# Patient Record
Sex: Male | Born: 1990 | Race: Black or African American | Hispanic: No | Marital: Single | Smoking: Current every day smoker
Health system: Southern US, Community
[De-identification: ages and names within clinical notes are randomized; demographics above are authoritative.]

## PROBLEM LIST (undated history)

## (undated) DIAGNOSIS — F319 Bipolar disorder, unspecified: Secondary | ICD-10-CM

## (undated) DIAGNOSIS — Z21 Asymptomatic human immunodeficiency virus [HIV] infection status: Secondary | ICD-10-CM

## (undated) DIAGNOSIS — B1920 Unspecified viral hepatitis C without hepatic coma: Secondary | ICD-10-CM

## (undated) DIAGNOSIS — B20 Human immunodeficiency virus [HIV] disease: Secondary | ICD-10-CM

---
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
## 2010-08-03 ENCOUNTER — Emergency Department (HOSPITAL_COMMUNITY): Admission: EM | Admit: 2010-08-03 | Discharge: 2010-08-03 | Payer: Self-pay | Admitting: Emergency Medicine

## 2010-08-03 ENCOUNTER — Inpatient Hospital Stay (HOSPITAL_COMMUNITY): Admission: AD | Admit: 2010-08-03 | Discharge: 2010-08-08 | Payer: Self-pay | Admitting: Psychiatry

## 2010-08-03 ENCOUNTER — Ambulatory Visit: Payer: Self-pay | Admitting: Psychiatry

## 2010-09-08 ENCOUNTER — Inpatient Hospital Stay (HOSPITAL_COMMUNITY)
Admission: EM | Admit: 2010-09-08 | Discharge: 2010-09-21 | Payer: Self-pay | Attending: Psychiatry | Admitting: Psychiatry

## 2010-12-10 LAB — HEPATITIS C ANTIBODY: HCV Ab: NEGATIVE

## 2010-12-10 LAB — RPR: RPR Ser Ql: NONREACTIVE

## 2010-12-10 LAB — GC/CHLAMYDIA PROBE AMP, URINE: GC Probe Amp, Urine: NEGATIVE

## 2010-12-11 LAB — BASIC METABOLIC PANEL
BUN: 8 mg/dL (ref 6–23)
CO2: 29 mEq/L (ref 19–32)
Chloride: 106 mEq/L (ref 96–112)
Glucose, Bld: 115 mg/dL — ABNORMAL HIGH (ref 70–99)

## 2010-12-11 LAB — DIFFERENTIAL
Basophils Absolute: 0 10*3/uL (ref 0.0–0.1)
Basophils Relative: 1 % (ref 0–1)
Eosinophils Relative: 3 % (ref 0–5)
Lymphocytes Relative: 40 % (ref 12–46)
Lymphs Abs: 2.1 10*3/uL (ref 0.7–4.0)
Monocytes Absolute: 0.4 10*3/uL (ref 0.1–1.0)

## 2010-12-11 LAB — CBC
Hemoglobin: 16.2 g/dL (ref 13.0–17.0)
MCHC: 34.7 g/dL (ref 30.0–36.0)
RBC: 5.45 MIL/uL (ref 4.22–5.81)
WBC: 5.2 10*3/uL (ref 4.0–10.5)

## 2010-12-11 LAB — RAPID URINE DRUG SCREEN, HOSP PERFORMED
Barbiturates: NOT DETECTED
Opiates: NOT DETECTED

## 2011-02-15 NOTE — Procedures (Signed)
EEG NUMBER:  [id number].   CLINICAL HISTORY:  The patient 20 year old young man with four episodes of  seizure-like activity described as loss of consciousness and shaking all  over lasting about 1 minute (780.39).   PROCEDURE:  The tracing is carried out on a 32 channel digital Cadwell  recorder reformatted into 16 channel montages with one devoted to EKG.  The  patient was awake and drowsy during the recording.  The International 10-20  system lead placement used.   DESCRIPTION OF FINDINGS:  Dominant frequency is a 9-11 Hz 40-80 microvolt  activity that is well regulated.   Background activity consists of mixed frequency alpha and upper theta range  activity with frontally predominant beta range components.  The patient  becomes drowsy with mixed frequency theta and delta range components.  The  patient does not drift into natural sleep.   There was no focal slowing.  There was no interictal epileptiform activity  in the form of spikes or sharp waves.  Photic stimulation failed to induce a  driving response.  Hyperventilation caused arousal from drowsiness.   EKG showed a regular sinus rhythm with a ventricular response of 60-66 beats  per minute.   IMPRESSION:  Normal record with the patient awake and drowsy.      Deanna Artis. Sharene Skeans, M.D.  Electronically Signed     ZOX:WRUE  D:  06/24/2006 12:42:43  T:  06/26/2006 10:26:25  Job #:  [id number]

## 2011-11-11 ENCOUNTER — Encounter (HOSPITAL_COMMUNITY): Payer: Self-pay

## 2011-11-11 ENCOUNTER — Emergency Department (HOSPITAL_COMMUNITY): Payer: Self-pay

## 2011-11-11 ENCOUNTER — Emergency Department (HOSPITAL_COMMUNITY)
Admission: EM | Admit: 2011-11-11 | Discharge: 2011-11-11 | Disposition: A | Discharge status: Home / Self Care | Payer: No Typology Code available for payment source | Attending: Emergency Medicine | Admitting: Emergency Medicine

## 2011-11-11 DIAGNOSIS — F17200 Nicotine dependence, unspecified, uncomplicated: Secondary | ICD-10-CM | POA: Insufficient documentation

## 2011-11-11 DIAGNOSIS — IMO0002 Reserved for concepts with insufficient information to code with codable children: Secondary | ICD-10-CM | POA: Insufficient documentation

## 2011-11-11 DIAGNOSIS — R51 Headache: Secondary | ICD-10-CM | POA: Insufficient documentation

## 2011-11-11 DIAGNOSIS — M25562 Pain in left knee: Secondary | ICD-10-CM

## 2011-11-11 DIAGNOSIS — M25569 Pain in unspecified knee: Secondary | ICD-10-CM | POA: Insufficient documentation

## 2011-11-11 DIAGNOSIS — M79609 Pain in unspecified limb: Secondary | ICD-10-CM | POA: Insufficient documentation

## 2011-11-11 MED ORDER — HYDROCODONE-ACETAMINOPHEN 5-325 MG PO TABS
1.0000 | ORAL_TABLET | Freq: Once | ORAL | Status: AC
  Administered 2011-11-11: 1 via ORAL
  Filled 2011-11-11: qty 1

## 2011-11-11 MED ORDER — HYDROCODONE-ACETAMINOPHEN 5-325 MG PO TABS: 1.0000 | ORAL_TABLET | ORAL | Status: AC | PRN
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
## 2011-11-11 NOTE — ED Provider Notes (Signed)
Medical screening examination/treatment/procedure(s) were performed by non-physician practitioner and as supervising physician I was immediately available for consultation/collaboration.   Joya Gaskins, MD 11/11/11 [phone]
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
## 2011-11-11 NOTE — Progress Notes (Signed)
Orthopedic Tech Progress Note Patient Details:  Mark [name] [birth year] [id number]  Other Ortho Devices Type of Ortho Device: Knee Sleeve Ortho Device Location: left knee Ortho Device Interventions: Application   Nikki Dom 11/11/2011, 10:16 PM

## 2013-02-28 ENCOUNTER — Emergency Department (HOSPITAL_COMMUNITY)
Admission: EM | Admit: 2013-02-28 | Discharge: 2013-02-28 | Disposition: A | Discharge status: Home / Self Care | Payer: Self-pay | Attending: Emergency Medicine | Admitting: Emergency Medicine

## 2013-02-28 ENCOUNTER — Encounter (HOSPITAL_COMMUNITY): Payer: Self-pay | Admitting: Emergency Medicine

## 2013-02-28 DIAGNOSIS — F17200 Nicotine dependence, unspecified, uncomplicated: Secondary | ICD-10-CM | POA: Insufficient documentation

## 2013-02-28 DIAGNOSIS — F319 Bipolar disorder, unspecified: Secondary | ICD-10-CM | POA: Insufficient documentation

## 2013-02-28 DIAGNOSIS — J3489 Other specified disorders of nose and nasal sinuses: Secondary | ICD-10-CM | POA: Insufficient documentation

## 2013-02-28 DIAGNOSIS — R51 Headache: Secondary | ICD-10-CM | POA: Insufficient documentation

## 2013-02-28 DIAGNOSIS — B1920 Unspecified viral hepatitis C without hepatic coma: Secondary | ICD-10-CM | POA: Insufficient documentation

## 2013-02-28 DIAGNOSIS — J029 Acute pharyngitis, unspecified: Secondary | ICD-10-CM | POA: Insufficient documentation

## 2013-02-28 HISTORY — DX: Unspecified viral hepatitis C without hepatic coma: B19.20

## 2013-02-28 HISTORY — DX: Bipolar disorder, unspecified: F31.9

## 2013-02-28 LAB — RAPID STREP SCREEN (MED CTR MEBANE ONLY): Streptococcus, Group A Screen (Direct): NEGATIVE

## 2013-02-28 MED ORDER — CEPHALEXIN 500 MG PO CAPS: ORAL_CAPSULE | ORAL | Status: DC

## 2013-02-28 MED ORDER — SULFAMETHOXAZOLE-TRIMETHOPRIM 800-160 MG PO TABS: 1.0000 | ORAL_TABLET | Freq: Two times a day (BID) | ORAL | Status: DC

## 2013-02-28 NOTE — ED Notes (Signed)
C/o sore throat x 1 week.

## 2013-02-28 NOTE — ED Provider Notes (Signed)
History     CSN: [id number]  Arrival date & time 02/28/13  1719   First MD Initiated Contact with Patient 02/28/13 1755      Chief Complaint  Patient presents with  . Sore Throat    (Consider location/radiation/quality/duration/timing/severity/associated sxs/prior treatment) HPI Mark [name] is a 22 y/o M with PMHx of Bipolar disorder and Hep C presenting to the ED with sore throat x 10 days - patient reported that the pain is constant, described as a burning sensation every time he swallows. Stated that the pain is worse with meat and hot fluids. Reported that the pain feels better with icy fluids. Patient reported that he has been using Cepacol with minimal relief. Stated that he has had mild nasal congestion this past week and minor headaches intermittently throughout the week. Reported that he is allergic to pollen. Denied fever, chills, sweating, inability to swallow, gi symptoms, urinary symptoms, ear complaints, eye complaints.    Past Medical History  Diagnosis Date  . Bipolar 1 disorder   . Hepatitis C     History reviewed. No pertinent past surgical history.  No family history on file.  History  Substance Use Topics  . Smoking status: Current Every Day Smoker  . Smokeless tobacco: Not on file  . Alcohol Use: No      Review of Systems  Constitutional: Negative for fever, chills and fatigue.  HENT: Positive for congestion (nasal) and sore throat. Negative for facial swelling, rhinorrhea, trouble swallowing, neck pain and neck stiffness.   Eyes: Negative for photophobia and visual disturbance.  Respiratory: Negative for chest tightness and shortness of breath.   Cardiovascular: Negative for chest pain.  Gastrointestinal: Negative for nausea, vomiting, abdominal pain, diarrhea, constipation and blood in stool.  Genitourinary: Negative for dysuria, hematuria, decreased urine volume and difficulty urinating.  Musculoskeletal: Negative for back pain and arthralgias.   Skin: Negative for rash.  Neurological: Positive for headaches. Negative for dizziness, weakness, light-headedness and numbness.  All other systems reviewed and are negative.    Allergies  Review of patient's allergies indicates no known allergies.  Home Medications   Current Outpatient Rx  Name  Route  Sig  Dispense  Refill  . cephALEXin (KEFLEX) 500 MG capsule      2 caps po bid x 7 days   28 capsule   0   . sulfamethoxazole-trimethoprim (BACTRIM DS,SEPTRA DS) 800-160 MG per tablet   Oral   Take 1 tablet by mouth 2 (two) times daily. One po bid x 7 days   14 tablet   0     BP 136/72  Pulse 82  Temp(Src) 99.9 F (37.7 C) (Oral)  Resp 20  SpO2 98%  Physical Exam  Nursing note and vitals reviewed. Constitutional: He is oriented to person, place, and time. He appears well-developed and well-nourished. No distress.  HENT:  Head: Normocephalic and atraumatic. No trismus in the jaw.  Mouth/Throat: Uvula is midline, oropharynx is clear and moist and mucous membranes are normal. No oral lesions. Normal dentition. No dental abscesses, edematous or lacerations. No oropharyngeal exudate, posterior oropharyngeal edema or posterior oropharyngeal erythema.  Uvula midline, symmetrical elevation Mild swelling and erythema noted to bilateral tonsils - negative exudate, petechiae noted Erythema noted to the posterior oropharynx.   Eyes: Conjunctivae and EOM are normal. Pupils are equal, round, and reactive to light. Right eye exhibits no discharge. Left eye exhibits no discharge.  Neck: Normal range of motion. Neck supple. No tracheal deviation present.  Negative  neck stiffness Negative nuchal rigidity  Mild swelling noted to bilateral tonsils - mobile, tender  Cardiovascular: Normal rate, regular rhythm and normal heart sounds.  Exam reveals no friction rub.   No murmur heard. Pulses:      Radial pulses are 2+ on the right side, and 2+ on the left side.  Pulmonary/Chest: Effort  normal and breath sounds normal. No respiratory distress. He has no wheezes. He has no rales.  Lymphadenopathy:    He has no cervical adenopathy.  Neurological: He is alert and oriented to person, place, and time. No cranial nerve deficit. He exhibits normal muscle tone. Coordination normal.  Cranial nerves III-XII grossly intact  Skin: Skin is warm and dry. No rash noted. He is not diaphoretic. No erythema.  Psychiatric: He has a normal mood and affect. His behavior is normal. Thought content normal.    ED Course  Procedures (including critical care time)  Labs Reviewed  RAPID STREP SCREEN  CULTURE, GROUP A STREP   No results found.   1. Sore throat       MDM  Patient stable, afebrile. Negative strep test. Uvula midline, mild erythema noted - mild discomfort upon palpation to the tonsils - less likely to be presence of peritonsillar abscess. Suspicion is to be of viral illness in nature. Patient aseptic, non-toxic appearing, no respiratory distress. Patient discharged - discharged with antibiotic coverage since sore throat ongoing for more than a week, with erythema and tenderness to palpation of the tonsils. Discussed care, rest, hydration. Referred to follow-up with PCP. Discussed with patient to monitor symptoms and if symptoms are to worsen or change to report back to the ED. Patient agreed to plan of care, understood, all questions answered.        Raymon Mutton, PA-C 03/01/13 0144

## 2013-03-01 NOTE — ED Provider Notes (Signed)
Medical screening examination/treatment/procedure(s) were performed by non-physician practitioner and as supervising physician I was immediately available for consultation/collaboration.  Margues Filippini, MD 03/01/13 0816 

## 2013-03-02 LAB — CULTURE, GROUP A STREP

## 2013-10-19 ENCOUNTER — Ambulatory Visit: Payer: Self-pay

## 2013-12-10 ENCOUNTER — Telehealth: Payer: Self-pay

## 2013-12-10 NOTE — Telephone Encounter (Signed)
Patient's cell number [phone] is disconnected .  His granmothers phone number is listed on referral as additional contact.  I left a message on machine for patient to contact our office for a new intake appointment.     [facility]    Tammy Gorden HarmsK King, RN

## 2014-12-20 DIAGNOSIS — R011 Cardiac murmur, unspecified: Secondary | ICD-10-CM | POA: Insufficient documentation

## 2014-12-20 DIAGNOSIS — F319 Bipolar disorder, unspecified: Secondary | ICD-10-CM | POA: Insufficient documentation

## 2016-03-02 ENCOUNTER — Emergency Department (HOSPITAL_COMMUNITY)
Admission: EM | Admit: 2016-03-02 | Discharge: 2016-03-02 | Disposition: A | Discharge status: Home / Self Care | Payer: Self-pay | Attending: Emergency Medicine | Admitting: Emergency Medicine

## 2016-03-02 ENCOUNTER — Encounter (HOSPITAL_COMMUNITY): Payer: Self-pay | Admitting: Nurse Practitioner

## 2016-03-02 ENCOUNTER — Emergency Department (HOSPITAL_COMMUNITY): Payer: Self-pay

## 2016-03-02 DIAGNOSIS — Z79899 Other long term (current) drug therapy: Secondary | ICD-10-CM | POA: Insufficient documentation

## 2016-03-02 DIAGNOSIS — R197 Diarrhea, unspecified: Secondary | ICD-10-CM | POA: Insufficient documentation

## 2016-03-02 DIAGNOSIS — B20 Human immunodeficiency virus [HIV] disease: Secondary | ICD-10-CM | POA: Insufficient documentation

## 2016-03-02 DIAGNOSIS — R51 Headache: Secondary | ICD-10-CM | POA: Insufficient documentation

## 2016-03-02 DIAGNOSIS — R059 Cough, unspecified: Secondary | ICD-10-CM

## 2016-03-02 DIAGNOSIS — Z8659 Personal history of other mental and behavioral disorders: Secondary | ICD-10-CM | POA: Insufficient documentation

## 2016-03-02 DIAGNOSIS — R05 Cough: Secondary | ICD-10-CM | POA: Insufficient documentation

## 2016-03-02 DIAGNOSIS — R112 Nausea with vomiting, unspecified: Secondary | ICD-10-CM | POA: Insufficient documentation

## 2016-03-02 DIAGNOSIS — J029 Acute pharyngitis, unspecified: Secondary | ICD-10-CM | POA: Insufficient documentation

## 2016-03-02 DIAGNOSIS — R519 Headache, unspecified: Secondary | ICD-10-CM

## 2016-03-02 DIAGNOSIS — R0981 Nasal congestion: Secondary | ICD-10-CM | POA: Insufficient documentation

## 2016-03-02 DIAGNOSIS — F17200 Nicotine dependence, unspecified, uncomplicated: Secondary | ICD-10-CM | POA: Insufficient documentation

## 2016-03-02 HISTORY — DX: Asymptomatic human immunodeficiency virus (hiv) infection status: Z21

## 2016-03-02 HISTORY — DX: Human immunodeficiency virus (HIV) disease: B20

## 2016-03-02 LAB — COMPREHENSIVE METABOLIC PANEL
ALT: 34 U/L (ref 17–63)
AST: 39 U/L (ref 15–41)
Albumin: 3.8 g/dL (ref 3.5–5.0)
Alkaline Phosphatase: 68 U/L (ref 38–126)
Anion gap: 8 (ref 5–15)
BUN: 10 mg/dL (ref 6–20)
CO2: 27 mmol/L (ref 22–32)
Calcium: 9 mg/dL (ref 8.9–10.3)
Chloride: 99 mmol/L — ABNORMAL LOW (ref 101–111)
Creatinine, Ser: 1.01 mg/dL (ref 0.61–1.24)
GFR calc Af Amer: >60 mL/min (ref >60)
GFR calc non Af Amer: >60 mL/min (ref >60)
Glucose, Bld: 89 mg/dL (ref 65–99)
Potassium: 3.8 mmol/L (ref 3.5–5.1)
Sodium: 134 mmol/L — ABNORMAL LOW (ref 135–145)
Total Bilirubin: 0.6 mg/dL (ref 0.3–1.2)
Total Protein: 7.7 g/dL (ref 6.5–8.1)

## 2016-03-02 LAB — CBC WITH DIFFERENTIAL/PLATELET
Basophils Absolute: 0 10*3/uL (ref 0.0–0.1)
Basophils Relative: 0 %
Eosinophils Absolute: 0.3 10*3/uL (ref 0.0–0.7)
Eosinophils Relative: 3 %
HCT: 44.1 % (ref 39.0–52.0)
Hemoglobin: 15 g/dL (ref 13.0–17.0)
Lymphocytes Relative: 30 %
Lymphs Abs: 2.2 10*3/uL (ref 0.7–4.0)
MCH: 29.8 pg (ref 26.0–34.0)
MCHC: 34 g/dL (ref 30.0–36.0)
MCV: 87.5 fL (ref 78.0–100.0)
Monocytes Absolute: 0.6 10*3/uL (ref 0.1–1.0)
Monocytes Relative: 9 %
Neutro Abs: 4.2 10*3/uL (ref 1.7–7.7)
Neutrophils Relative %: 58 %
Platelets: 384 10*3/uL (ref 150–400)
RBC: 5.04 MIL/uL (ref 4.22–5.81)
RDW: 12.3 % (ref 11.5–15.5)
WBC: 7.3 10*3/uL (ref 4.0–10.5)

## 2016-03-02 LAB — POC OCCULT BLOOD, ED: FECAL OCCULT BLD: NEGATIVE

## 2016-03-02 MED ORDER — PROCHLORPERAZINE EDISYLATE 5 MG/ML IJ SOLN
5.0000 mg | Freq: Once | INTRAMUSCULAR | Status: AC
  Administered 2016-03-02: 5 mg via INTRAVENOUS
  Filled 2016-03-02: qty 2

## 2016-03-02 MED ORDER — POLYMYXIN B-TRIMETHOPRIM 10000-0.1 UNIT/ML-% OP SOLN: 1.0000 [drp] | OPHTHALMIC | Status: DC

## 2016-03-02 MED ORDER — CETIRIZINE-PSEUDOEPHEDRINE ER 5-120 MG PO TB12: 1.0000 | ORAL_TABLET | Freq: Two times a day (BID) | ORAL | Status: DC

## 2016-03-02 MED ORDER — BENZONATATE 100 MG PO CAPS: 100.0000 mg | ORAL_CAPSULE | Freq: Three times a day (TID) | ORAL | Status: DC

## 2016-03-02 MED ORDER — DIPHENHYDRAMINE HCL 50 MG/ML IJ SOLN
25.0000 mg | Freq: Once | INTRAMUSCULAR | Status: AC
  Administered 2016-03-02: 25 mg via INTRAVENOUS
  Filled 2016-03-02: qty 1

## 2016-03-02 MED ORDER — SODIUM CHLORIDE 0.9 % IV BOLUS (SEPSIS)
1000.0000 mL | Freq: Once | INTRAVENOUS | Status: AC
  Administered 2016-03-02: 1000 mL via INTRAVENOUS

## 2016-03-02 MED ORDER — AZITHROMYCIN 250 MG PO TABS: ORAL_TABLET | ORAL | Status: DC

## 2016-03-02 NOTE — ED Notes (Signed)
Pt c/o 2 week history of headaches, congestion, rinnorhea, cough, nausea. He tried garlic and onions, lemon water, bc powder, exedrin with no relief. He is alert and breathing easily

## 2016-03-02 NOTE — Discharge Instructions (Signed)
Medications: Zyrtec-D, Tessalon, Polytrim, azithromycin  Treatment: Take Zyrtec-D twice daily. Take Tessalon every 8 hours as needed for your cough. Place 1 drop of Polytrim in left eye every 4 hours for 1 week. Take azithromycin as prescribed for 5 days. Drink plenty of fluids and eat as tolerated. Make sure to eat at least a little something when taking medication. Get plenty of rest.  Follow-up: Please establish care with infectious disease by calling the number outlined on your discharge paperwork. Please follow-up and establish care with a primary care provider in the area by calling the number circled on your discharge paperwork. Please return to the emergency department if he develop any new or worsening symptoms, or your symptoms are not improving throughout the next week.

## 2016-03-02 NOTE — ED Notes (Signed)
Pt advised to wait for A. Law, PA, to advise about need for antibiotics.

## 2016-03-02 NOTE — ED Provider Notes (Signed)
CSN: [id number]     Arrival date & time 03/02/16  1738 History   First MD Initiated Contact with Patient 03/02/16 1804     Chief Complaint  Patient presents with  . Influenza     (Consider location/radiation/quality/duration/timing/severity/associated sxs/prior Treatment) HPI Comments: Patient is a 25 year old [name] with history of HIV, hepatitis C, bipolar disorder who who presents with a one-week history of productive cough, nasal congestion, intermittent headaches, intermittent diarrhea, and 2 episodes of vomiting. Patient also reports AM left eye green drainage that is "goopy." Patient reports this symptom is improving, but still occurring. Patient has tried cough syrup and cough drops, Excedrin, BC powders, and one dose of an unknown antibiotic. Patient denies any known fevers. Patient also denies photophobia related to his headaches. Patient does have a history of migraines in the past. Patient describes his headache as left-sided and throbbing in nature. Patient states his cough has been productive with yellow/green phlegm. Patient has seen some red tinge to his phlegm. Patient states he has been coughing uncontrollably. Patient reports he has had diarrhea one out of his 3 bowel movements per day. He states that he saw bright red and green stool that his bowel movement last evening. Patient denies any chest pain, shortness of breath, abdominal pain, current nausea, dysuria. The patient's most recent CD4 count within the past month was 684. Patient is seen by infectious disease doctor in [place]. He hopes to find one closer, as he just moved here recently.  Patient is a Mark [name] presenting with flu symptoms. The history is provided by the patient.  Influenza Presenting symptoms: cough, headache, nausea, sore throat and vomiting   Presenting symptoms: no fever and no shortness of breath   Associated symptoms: nasal congestion   Associated symptoms: no chills     Past Medical History   Diagnosis Date  . Bipolar 1 disorder (HCC)   . Hepatitis C   . HIV (human immunodeficiency virus infection) (HCC)    History reviewed. No pertinent past surgical history. History reviewed. No pertinent family history. Social History  Substance Use Topics  . Smoking status: Current Every Day Smoker  . Smokeless tobacco: None  . Alcohol Use: Yes    Review of Systems  Constitutional: Negative for fever and chills.  HENT: Positive for congestion and sore throat. Negative for facial swelling and trouble swallowing.   Eyes: Negative for photophobia and visual disturbance.  Respiratory: Positive for cough. Negative for shortness of breath.   Cardiovascular: Negative for chest pain.  Gastrointestinal: Positive for nausea and vomiting. Negative for abdominal pain.  Genitourinary: Negative for dysuria.  Musculoskeletal: Negative for back pain.  Skin: Negative for rash and wound.  Neurological: Positive for headaches.  Psychiatric/Behavioral: The patient is not nervous/anxious.       Allergies  Review of patient's allergies indicates no known allergies.  Home Medications   Prior to Admission medications   Medication Sig Start Date End Date Taking? Authorizing Provider  azithromycin (ZITHROMAX Z-PAK) 250 MG tablet Take 2 tablets on the first day, and one tablet on days 2 through 5. 03/02/16   Alexey Rhoads M Dynasti Kerman, PA-C  benzonatate (TESSALON) 100 MG capsule Take 1 capsule (100 mg total) by mouth every 8 (eight) hours. 03/02/16   Emi Holes, PA-C  cephALEXin (KEFLEX) 500 MG capsule 2 caps po bid x 7 days 02/28/13   Marissa Sciacca, PA-C  cetirizine-pseudoephedrine (ZYRTEC-D) 5-120 MG tablet Take 1 tablet by mouth 2 (two) times daily. 03/02/16  Tayvia Faughnan M Kemo Spruce, PA-C  sulfamethoxazole-trimethoprim (BACTRIM DS,SEPTRA DS) 800-160 MG per tablet Take 1 tablet by mouth 2 (two) times daily. One po bid x 7 days 02/28/13   Marissa Sciacca, PA-C  trimethoprim-polymyxin b (POLYTRIM) ophthalmic solution  Place 1 drop into the left eye every 4 (four) hours. 03/02/16   Emina Ribaudo M Nahla Lukin, PA-C   BP 128/74 mmHg  Pulse 63  Temp(Src) 98.2 F (36.8 C) (Oral)  Resp 16  SpO2 96% Physical Exam  Constitutional: He appears well-developed and well-nourished. No distress.  HENT:  Head: Normocephalic and atraumatic.  Eyes: Conjunctivae are normal. Pupils are equal, round, and reactive to light. Right eye exhibits no discharge. Left eye exhibits no discharge. No scleral icterus.  Neck: Normal range of motion. Neck supple. No thyromegaly present.  Cardiovascular: Normal rate, regular rhythm, normal heart sounds and intact distal pulses.  Exam reveals no gallop and no friction rub.   No murmur heard. Pulmonary/Chest: Effort normal and breath sounds normal. No stridor. No respiratory distress. He has no wheezes. He has no rales.  Abdominal: Soft. Bowel sounds are normal. He exhibits no distension. There is no tenderness. There is no rebound and no guarding.  Musculoskeletal: He exhibits no edema.  Lymphadenopathy:    He has no cervical adenopathy.  Neurological: He is alert. Coordination normal.  CN 3-12 intact; normal sensation throughout; 5/5 strength in all 4 extremities; equal bilateral grip strength   Skin: Skin is warm and dry. No rash noted. He is not diaphoretic. No pallor.  Psychiatric: He has a normal mood and affect.  Nursing note and vitals reviewed.   ED Course  Procedures (including critical care time) Labs Review Labs Reviewed  COMPREHENSIVE METABOLIC PANEL - Abnormal; Notable for the following:    Sodium 134 (*)    Chloride 99 (*)    All other components within normal limits  CBC WITH DIFFERENTIAL/PLATELET  POC OCCULT BLOOD, ED    Imaging Review Dg Chest 2 View  03/02/2016  CLINICAL DATA:  Shortness of breath and cough for 1 week EXAM: CHEST  2 VIEW COMPARISON:  None. FINDINGS: Lungs are clear. Heart size and pulmonary vascularity are normal. No adenopathy. No bone lesions.  IMPRESSION: No edema or consolidation. Electronically Signed   By: Bretta Bang III M.D.   On: 03/02/2016 19:47   I have personally reviewed and evaluated these images and lab results as part of my medical decision-making.   EKG Interpretation None      MDM   CBC unremarkable. CMP shows sodium 134, chloride 99. Fecal occult negative. Chest x-ray shows no edema or consolidation. I suspect viral syndrome, however because of patient's HIV status, I will add azithromycin. Patient left before I could give him this prescription and I called his home and spoke to his grandmother who will relay the patient that he has a prescription waiting for him at registration. Patient also discharged with Zyrtec-D, Tessalon, Polytrim for possible bacterial conjunctivitis. I suspect headache is an associated symptom with patient's viral syndrome, secondary cause very unlikely. Normal neuro exam, no focal deficits. Patient's headache improved with Compazine and Benadryl while in ED. Patient discussed with Dr. Jeraldine Loots who is in agreement with plan. Patient vitals stable throughout ED course and discharged in satisfactory condition. Patient advised to follow up and establish care with infectious disease for follow-up and treatment of his HIV in this area.  Final diagnoses:  Cough  Nasal congestion  Acute nonintractable headache, unspecified headache type  Nausea vomiting and  diarrhea       Emi Holeslexandra M Fedra Lanter, PA-C 03/02/16 2215  Gerhard Munchobert Lockwood, MD 03/02/16 [phone]

## 2016-04-22 ENCOUNTER — Telehealth: Payer: Self-pay

## 2016-04-22 NOTE — Telephone Encounter (Signed)
Patient walked into office requesting ADAP renewal.  He has never been seen at this office.  He has a note from [facility] stating it is time for renewal.  I explained to patient he  will need a new patient visit and labs.  He will need to bring the required paperwork to qualify for ADAP. He met with Juliann Pulse today so he would know the specific documents needed.  Financial , lab and office visit appointments scheduled. Patient is to return as scheduled.   Laverle Patter, RN

## 2016-05-14 ENCOUNTER — Other Ambulatory Visit (INDEPENDENT_AMBULATORY_CARE_PROVIDER_SITE_OTHER): Payer: Self-pay

## 2016-05-14 ENCOUNTER — Ambulatory Visit: Payer: Self-pay

## 2016-05-14 DIAGNOSIS — Z113 Encounter for screening for infections with a predominantly sexual mode of transmission: Secondary | ICD-10-CM

## 2016-05-14 DIAGNOSIS — Z79899 Other long term (current) drug therapy: Secondary | ICD-10-CM

## 2016-05-14 DIAGNOSIS — B20 Human immunodeficiency virus [HIV] disease: Secondary | ICD-10-CM

## 2016-05-14 LAB — LIPID PANEL
Cholesterol: 159 mg/dL (ref 125–200)
HDL: 62 mg/dL (ref >=40)
LDL CALC: 78 mg/dL (ref <130)
Total CHOL/HDL Ratio: 2.6 Ratio (ref <=5.0)
Triglycerides: 96 mg/dL (ref <150)
VLDL: 19 mg/dL (ref <30)

## 2016-05-14 LAB — CBC WITH DIFFERENTIAL/PLATELET
BASOS PCT: 1 %
Basophils Absolute: 33 cells/uL (ref 0–200)
EOS ABS: 66 {cells}/uL (ref 15–500)
Eosinophils Relative: 2 %
HCT: 48.8 % (ref 38.5–50.0)
HEMOGLOBIN: 17.1 g/dL (ref 13.2–17.1)
LYMPHS PCT: 42 %
Lymphs Abs: 1386 cells/uL (ref 850–3900)
MCH: 30.9 pg (ref 27.0–33.0)
MCHC: 35 g/dL (ref 32.0–36.0)
MCV: 88.2 fL (ref 80.0–100.0)
MONO ABS: 363 {cells}/uL (ref 200–950)
MPV: 9.7 fL (ref 7.5–12.5)
Monocytes Relative: 11 %
NEUTROS ABS: 1452 {cells}/uL — AB (ref 1500–7800)
Neutrophils Relative %: 44 %
Platelets: 278 10*3/uL (ref 140–400)
RBC: 5.53 MIL/uL (ref 4.20–5.80)
RDW: 13.8 % (ref 11.0–15.0)
WBC: 3.3 10*3/uL — ABNORMAL LOW (ref 3.8–10.8)

## 2016-05-14 LAB — COMPLETE METABOLIC PANEL WITH GFR
ALBUMIN: 4.5 g/dL (ref 3.6–5.1)
ALK PHOS: 64 U/L (ref 40–115)
ALT: 39 U/L (ref 9–46)
AST: 27 U/L (ref 10–40)
BILIRUBIN TOTAL: 0.6 mg/dL (ref 0.2–1.2)
BUN: 12 mg/dL (ref 7–25)
CO2: 28 mmol/L (ref 20–31)
Calcium: 9.8 mg/dL (ref 8.6–10.3)
Chloride: 102 mmol/L (ref 98–110)
Creat: 1.2 mg/dL (ref 0.60–1.35)
GFR, EST NON AFRICAN AMERICAN: 84 mL/min (ref >=60)
Glucose, Bld: 84 mg/dL (ref 65–99)
Potassium: 5 mmol/L (ref 3.5–5.3)
Sodium: 138 mmol/L (ref 135–146)
TOTAL PROTEIN: 7.7 g/dL (ref 6.1–8.1)

## 2016-05-15 LAB — URINE CYTOLOGY ANCILLARY ONLY
CHLAMYDIA, DNA PROBE: NEGATIVE
NEISSERIA GONORRHEA: NEGATIVE

## 2016-05-15 LAB — URINALYSIS
Bilirubin Urine: NEGATIVE
GLUCOSE, UA: NEGATIVE
HGB URINE DIPSTICK: NEGATIVE
Ketones, ur: NEGATIVE
LEUKOCYTES UA: NEGATIVE
Nitrite: NEGATIVE
PH: 5.5 (ref 5.0–8.0)
Protein, ur: NEGATIVE
Specific Gravity, Urine: 1.026 (ref 1.001–1.035)

## 2016-05-15 LAB — T-HELPER CELLS (CD4) COUNT (NOT AT ARMC)
CD4 % Helper T Cell: 34 % (ref 33–55)
CD4 T Cell Abs: 470 /uL (ref 400–2700)

## 2016-05-15 LAB — HEPATITIS B SURFACE ANTIGEN: HEP B S AG: NEGATIVE

## 2016-05-15 LAB — HEPATITIS B SURFACE ANTIBODY,QUALITATIVE: HEP B S AB: POSITIVE — AB

## 2016-05-15 LAB — RPR

## 2016-05-15 LAB — HEPATITIS C ANTIBODY: HCV Ab: REACTIVE — AB

## 2016-05-15 LAB — HEPATITIS A ANTIBODY, TOTAL: HEP A TOTAL AB: BORDERLINE — AB

## 2016-05-15 LAB — HEPATITIS B CORE ANTIBODY, TOTAL: Hep B Core Total Ab: NONREACTIVE

## 2016-05-16 LAB — QUANTIFERON TB GOLD ASSAY (BLOOD)
INTERFERON GAMMA RELEASE ASSAY: NEGATIVE
Mitogen-Nil: 8.72 IU/mL
QUANTIFERON NIL VALUE: 0.04 [IU]/mL
QUANTIFERON TB AG MINUS NIL: 0 [IU]/mL

## 2016-05-16 LAB — HIV-1 RNA ULTRAQUANT REFLEX TO GENTYP+
HIV 1 RNA Quant: <20 copies/mL (ref <20)
HIV-1 RNA Quant, Log: <1.3 Log copies/mL (ref <1.30)

## 2016-05-17 LAB — HEPATITIS C RNA QUANTITATIVE
HCV QUANT LOG: 5.46 {Log} — AB (ref <1.18)
HCV Quantitative: 289199 IU/mL — ABNORMAL HIGH (ref <15)

## 2016-05-22 ENCOUNTER — Encounter: Payer: Self-pay | Admitting: Internal Medicine

## 2016-05-23 LAB — HLA B*5701: HLA-B 5701 W/RFLX HLA-B HIGH: NEGATIVE

## 2016-05-28 ENCOUNTER — Ambulatory Visit: Payer: Self-pay | Admitting: Internal Medicine

## 2016-08-02 ENCOUNTER — Telehealth: Payer: Self-pay | Admitting: Internal Medicine

## 2016-08-02 NOTE — Telephone Encounter (Signed)
Patient says he didn't know he had missed his np appointment in August and he is now out of medicine.  The next available appt is Dec 5.  He wants to speak to a nurse about what he can do until then regarding his medication.

## 2016-08-02 NOTE — Telephone Encounter (Signed)
Called patient back and was put on hold. Triage lines are busy and had to hang up the phone. He has not established with an MD at [facility] yet, so Olegario MessierKathy is unable to do [facility]. Wendall MolaJacqueline Cockerham

## 2016-09-03 ENCOUNTER — Emergency Department (HOSPITAL_COMMUNITY)
Admission: EM | Admit: 2016-09-03 | Discharge: 2016-09-03 | Disposition: A | Discharge status: Home / Self Care | Payer: Self-pay | Attending: Emergency Medicine | Admitting: Emergency Medicine

## 2016-09-03 ENCOUNTER — Encounter (HOSPITAL_COMMUNITY): Payer: Self-pay | Admitting: Emergency Medicine

## 2016-09-03 ENCOUNTER — Ambulatory Visit: Payer: Self-pay | Admitting: Internal Medicine

## 2016-09-03 ENCOUNTER — Emergency Department (HOSPITAL_COMMUNITY): Payer: Self-pay

## 2016-09-03 DIAGNOSIS — J069 Acute upper respiratory infection, unspecified: Secondary | ICD-10-CM | POA: Insufficient documentation

## 2016-09-03 DIAGNOSIS — F17200 Nicotine dependence, unspecified, uncomplicated: Secondary | ICD-10-CM | POA: Insufficient documentation

## 2016-09-03 DIAGNOSIS — Z79899 Other long term (current) drug therapy: Secondary | ICD-10-CM | POA: Insufficient documentation

## 2016-09-03 DIAGNOSIS — G43809 Other migraine, not intractable, without status migrainosus: Secondary | ICD-10-CM | POA: Insufficient documentation

## 2016-09-03 MED ORDER — OXYCODONE-ACETAMINOPHEN 5-325 MG PO TABS
ORAL_TABLET | ORAL | Status: AC
  Filled 2016-09-03: qty 1

## 2016-09-03 MED ORDER — PROCHLORPERAZINE EDISYLATE 5 MG/ML IJ SOLN
10.0000 mg | Freq: Once | INTRAMUSCULAR | Status: AC
  Administered 2016-09-03: 10 mg via INTRAVENOUS
  Filled 2016-09-03: qty 2

## 2016-09-03 MED ORDER — ONDANSETRON 4 MG PO TBDP
4.0000 mg | ORAL_TABLET | Freq: Once | ORAL | Status: AC | PRN
  Administered 2016-09-03: 4 mg via ORAL

## 2016-09-03 MED ORDER — BENZONATATE 100 MG PO CAPS: 100.0000 mg | ORAL_CAPSULE | Freq: Three times a day (TID) | ORAL | 0 refills | Status: DC | PRN

## 2016-09-03 MED ORDER — ONDANSETRON 4 MG PO TBDP: 4.0000 mg | ORAL_TABLET | Freq: Three times a day (TID) | ORAL | 0 refills | Status: DC | PRN

## 2016-09-03 MED ORDER — DIPHENHYDRAMINE HCL 50 MG/ML IJ SOLN
25.0000 mg | Freq: Once | INTRAMUSCULAR | Status: AC
Start: 2016-09-03 — End: 2016-09-03
  Administered 2016-09-03: 25 mg via INTRAVENOUS
  Filled 2016-09-03: qty 1

## 2016-09-03 MED ORDER — ONDANSETRON 4 MG PO TBDP
ORAL_TABLET | ORAL | Status: DC
Start: 2016-09-03 — End: 2016-09-04
  Filled 2016-09-03: qty 1

## 2016-09-03 MED ORDER — OXYCODONE-ACETAMINOPHEN 5-325 MG PO TABS
1.0000 | ORAL_TABLET | ORAL | Status: DC | PRN
  Administered 2016-09-03: 1 via ORAL

## 2016-09-03 MED ORDER — SODIUM CHLORIDE 0.9 % IV BOLUS (SEPSIS)
1000.0000 mL | Freq: Once | INTRAVENOUS | Status: AC
  Administered 2016-09-03: 1000 mL via INTRAVENOUS

## 2016-09-03 NOTE — ED Triage Notes (Signed)
Pt states hes had a headache x2 days with two episodes of vomiting. C/o nausea.

## 2016-09-03 NOTE — ED Provider Notes (Signed)
[facility]-EMERGENCY DEPT Provider Note   CSN: [id number] Arrival date & time: 09/03/16  1753   By signing my name below, I, Freida Busman, attest that this documentation has been prepared under the direction and in the presence of Alvira Monday, MD . Electronically Signed: Freida Busman, Scribe. 09/03/2016. 7:50 PM.  History   Chief Complaint Chief Complaint  Patient presents with  . Headache    The history is provided by the patient. No language interpreter was used.    HPI Comments:  Mark [name] is a 25 y.o. male with a history of HIV, who presents to the Emergency Department complaining of a HA x 2 days. His pain is exacerbated by lights. Pt has a h/o migraines but states he hasn't had a HA this bad in a while.  Reports it started slowly, peaking last night. Similar to migraines he has had in the past.  He reports associated nausea and 2 episodes of vomiting today. He also reports productive cough x 3 days and sore throat with cough.  Pt reports multiple sick contacts at home and at work. He denies CP, SOB, neck pain/stiffness, diarrhea, dysuria, hematuria, fever, and ear pain. Pt is compliant with his daily HIV meds.   Past Medical History:  Diagnosis Date  . Bipolar 1 disorder (HCC)   . Hepatitis C   . HIV (human immunodeficiency virus infection) (HCC)     There are no active problems to display for this patient.   History reviewed. No pertinent surgical history.     Home Medications    Prior to Admission medications   Medication Sig Start Date End Date Taking? Authorizing Provider  azithromycin (ZITHROMAX Z-PAK) 250 MG tablet Take 2 tablets on the first day, and one tablet on days 2 through 5. 03/02/16   Alexandra M Law, PA-C  benzonatate (TESSALON) 100 MG capsule Take 1 capsule (100 mg total) by mouth 3 (three) times daily as needed for cough. 09/03/16   Alvira Monday, MD  cephALEXin (KEFLEX) 500 MG capsule 2 caps po bid x 7 days 02/28/13   Marissa Sciacca, PA-C    cetirizine-pseudoephedrine (ZYRTEC-D) 5-120 MG tablet Take 1 tablet by mouth 2 (two) times daily. 03/02/16   Alexandra M Law, PA-C  ondansetron (ZOFRAN ODT) 4 MG disintegrating tablet Take 1 tablet (4 mg total) by mouth every 8 (eight) hours as needed for nausea or vomiting. 09/03/16   Alvira Monday, MD  sulfamethoxazole-trimethoprim (BACTRIM DS,SEPTRA DS) 800-160 MG per tablet Take 1 tablet by mouth 2 (two) times daily. One po bid x 7 days 02/28/13   Marissa Sciacca, PA-C  trimethoprim-polymyxin b (POLYTRIM) ophthalmic solution Place 1 drop into the left eye every 4 (four) hours. 03/02/16   Emi Holes, PA-C    Family History No family history on file.  Social History Social History  Substance Use Topics  . Smoking status: Current Every Day Smoker  . Smokeless tobacco: Not on file  . Alcohol use Yes     Allergies   Patient has no known allergies.   Review of Systems Review of Systems  Constitutional: Negative for chills and fever.  HENT: Positive for sore throat. Negative for ear pain.   Eyes: Positive for photophobia.  Respiratory: Positive for cough. Negative for shortness of breath.   Cardiovascular: Negative for chest pain.  Gastrointestinal: Positive for nausea and vomiting. Negative for abdominal pain (side pain with cough) and diarrhea.  Genitourinary: Negative for dysuria and hematuria.  Musculoskeletal: Negative for neck pain and neck  stiffness.  Skin: Negative for rash.  Neurological: Positive for headaches. Negative for syncope.  All other systems reviewed and are negative.    Physical Exam Updated Vital Signs BP 126/71 (BP Location: Right Arm)   Pulse (!) 54   Temp 98.3 F (36.8 C) (Oral)   Resp 16   SpO2 99%   Physical Exam  Constitutional: He is oriented to person, place, and time. He appears well-developed and well-nourished. No distress.  HENT:  Head: Normocephalic and atraumatic.  Right Ear: External ear normal.  Left Ear: External ear normal.   Cerumen partially obstructing TM bilaterally, however visualized portions WNL  Eyes: Conjunctivae and EOM are normal. Pupils are equal, round, and reactive to light.  Neck: Normal range of motion. Neck supple. No neck rigidity. No Brudzinski's sign and no Kernig's sign noted.  Cardiovascular: Normal rate, regular rhythm, normal heart sounds and intact distal pulses.  Exam reveals no gallop and no friction rub.   No murmur heard. Pulmonary/Chest: Effort normal and breath sounds normal. No respiratory distress. He has no wheezes. He has no rales.  Abdominal: Soft. He exhibits no distension. There is no tenderness. There is no guarding.  Musculoskeletal: He exhibits no edema.       Cervical back: He exhibits normal range of motion.  Neurological: He is alert and oriented to person, place, and time. He has normal strength. No sensory deficit. Gait normal. GCS eye subscore is 4. GCS verbal subscore is 5. GCS motor subscore is 6.  PERRL, tongue and uvula midline, symmetric facies  Skin: Skin is warm and dry. He is not diaphoretic.  Nursing note and vitals reviewed.    ED Treatments / Results  DIAGNOSTIC STUDIES:  Oxygen Saturation is 98% on RA, normal by my interpretation.    COORDINATION OF CARE:  7:46 PM Discussed treatment plan with pt at bedside and pt agreed to plan.  Labs (all labs ordered are listed, but only abnormal results are displayed) Labs Reviewed - No data to display  EKG  EKG Interpretation None       Radiology Dg Chest 2 View  Result Date: 09/03/2016 CLINICAL DATA:  Sore throat and cough x2 days EXAM: CHEST  2 VIEW COMPARISON:  03/02/2016 FINDINGS: The heart size and mediastinal contours are within normal limits. Both lungs are clear. The visualized skeletal structures are unremarkable. IMPRESSION: No active cardiopulmonary disease. Electronically Signed   By: Tollie Ethavid  Kwon M.D.   On: 09/03/2016 21:40    Procedures Procedures (including critical care  time)  Medications Ordered in ED Medications  oxyCODONE-acetaminophen (PERCOCET/ROXICET) 5-325 MG per tablet 1 tablet (1 tablet Oral Given 09/03/16 1803)  ondansetron (ZOFRAN-ODT) 4 MG disintegrating tablet (not administered)  ondansetron (ZOFRAN-ODT) disintegrating tablet 4 mg (4 mg Oral Given 09/03/16 1803)  prochlorperazine (COMPAZINE) injection 10 mg (10 mg Intravenous Given 09/03/16 2017)  diphenhydrAMINE (BENADRYL) injection 25 mg (25 mg Intravenous Given 09/03/16 2016)  sodium chloride 0.9 % bolus 1,000 mL (0 mLs Intravenous Stopped 09/03/16 2155)     Initial Impression / Assessment and Plan / ED Course  I have reviewed the triage vital signs and the nursing notes.  Pertinent labs & imaging results that were available during my care of the patient were reviewed by me and considered in my medical decision making (see chart for details).  Clinical Course    25 year old male with a history of HIV compliant with meds, last recorded CD4 count 470 05/14/2016, Hep C, who presents with concern for cough, nasal  congestion, headache, 2 episodes of nausea and vomiting.  Given patient not immunocompromised on most recent check, compliant with meds, with full range of motion of neck, negative Kernig's and Brudzinski's, afebrile, with normal mentation, have low suspicion for meningitis/encephalitis.  No trauma, doubt SDH.  Slow onset of symptoms, and doubt subarachnoid hemorrhage.  Patient has a history of migraines, and this headache feels similar. He is given a migraine cocktail with significant improvement of headache. Regarding his cough, he had chest x-ray which shows no signs of pneumonia. Have low suspicion for other significant electrolyte abnormality given only 2 episodes of emesis today.  Patient hemodynamically stable, well appearing. Patient likely viral URI. He is given prescription for Tessalon Perles, and Zofran for nausea. Recommend close follow-up primary care physician, discussed reasons to  return in detail. Patient discharged in stable condition with understanding of reasons to return.   Final Clinical Impressions(s) / ED Diagnoses   Final diagnoses:  Other migraine without status migrainosus, not intractable  Upper respiratory tract infection, unspecified type    New Prescriptions Discharge Medication List as of 09/03/2016  9:50 PM    START taking these medications   Details  ondansetron (ZOFRAN ODT) 4 MG disintegrating tablet Take 1 tablet (4 mg total) by mouth every 8 (eight) hours as needed for nausea or vomiting., Starting Tue 09/03/2016, Print       I personally performed the services described in this documentation, which was scribed in my presence. The recorded information has been reviewed and is accurate.     Alvira MondayErin Tessia Kassin, MD 09/03/16 2229

## 2017-04-30 ENCOUNTER — Encounter (HOSPITAL_COMMUNITY): Payer: Self-pay

## 2017-04-30 DIAGNOSIS — F17210 Nicotine dependence, cigarettes, uncomplicated: Secondary | ICD-10-CM | POA: Insufficient documentation

## 2017-04-30 DIAGNOSIS — Z79899 Other long term (current) drug therapy: Secondary | ICD-10-CM | POA: Insufficient documentation

## 2017-04-30 DIAGNOSIS — B20 Human immunodeficiency virus [HIV] disease: Secondary | ICD-10-CM | POA: Insufficient documentation

## 2017-04-30 DIAGNOSIS — K0889 Other specified disorders of teeth and supporting structures: Secondary | ICD-10-CM | POA: Insufficient documentation

## 2017-04-30 NOTE — ED Notes (Signed)
Pt complains of dental pain for two months Pt prefers to be called a male

## 2017-05-01 ENCOUNTER — Emergency Department (HOSPITAL_COMMUNITY)
Admission: EM | Admit: 2017-05-01 | Discharge: 2017-05-01 | Disposition: A | Discharge status: Home / Self Care | Payer: Self-pay | Attending: Emergency Medicine | Admitting: Emergency Medicine

## 2017-05-01 DIAGNOSIS — K0889 Other specified disorders of teeth and supporting structures: Secondary | ICD-10-CM

## 2017-05-01 MED ORDER — NAPROXEN 500 MG PO TABS: 500.0000 mg | ORAL_TABLET | Freq: Two times a day (BID) | ORAL | 0 refills | Status: DC

## 2017-05-01 MED ORDER — PENICILLIN V POTASSIUM 500 MG PO TABS: 500.0000 mg | ORAL_TABLET | Freq: Four times a day (QID) | ORAL | 0 refills | Status: AC

## 2017-05-01 MED ORDER — KETOROLAC TROMETHAMINE 30 MG/ML IJ SOLN
30.0000 mg | Freq: Once | INTRAMUSCULAR | Status: AC
  Administered 2017-05-01: 30 mg via INTRAMUSCULAR
  Filled 2017-05-01: qty 1

## 2017-05-01 NOTE — ED Provider Notes (Signed)
[facility]-EMERGENCY DEPT Provider Note   CSN: [id number] Arrival date & time: 04/30/17  2345     History   Chief Complaint Chief Complaint  Patient presents with  . Dental Pain    HPI Mark [name] is a 26 y.o. male.  HPI  This is a 26 year old male with a history of hepatitis, HIV, bipolar disorder who presents with dental pain. Patient reports a 2 month history of waxing and waning with left upper and left lower dental pain. He reports that he occasionally uses topical anesthetic with some relief. However, the pain got too great this evening. He states "I started having a panic attack. He denies any recent dental procedures, mouth swelling, fevers, difficulty swallowing. He does not have a dentist currently. Current pain is 5 out of 10; however, he rates pain at 10 out of 10 prior to arrival.  Past Medical History:  Diagnosis Date  . Bipolar 1 disorder (HCC)   . Hepatitis C   . HIV (human immunodeficiency virus infection) (HCC)     There are no active problems to display for this patient.   History reviewed. No pertinent surgical history.     Home Medications    Prior to Admission medications   Medication Sig Start Date End Date Taking? Authorizing Provider  azithromycin (ZITHROMAX Z-PAK) 250 MG tablet Take 2 tablets on the first day, and one tablet on days 2 through 5. 03/02/16   Law, Alexandra M, PA-C  benzonatate (TESSALON) 100 MG capsule Take 1 capsule (100 mg total) by mouth 3 (three) times daily as needed for cough. 09/03/16   Alvira MondaySchlossman, Erin, MD  cephALEXin (KEFLEX) 500 MG capsule 2 caps po bid x 7 days 02/28/13   Sciacca, Marissa, PA-C  cetirizine-pseudoephedrine (ZYRTEC-D) 5-120 MG tablet Take 1 tablet by mouth 2 (two) times daily. 03/02/16   Law, Waylan BogaAlexandra M, PA-C  naproxen (NAPROSYN) 500 MG tablet Take 1 tablet (500 mg total) by mouth 2 (two) times daily. 05/01/17   Horton, Mayer Maskerourtney F, MD  ondansetron (ZOFRAN ODT) 4 MG disintegrating tablet Take 1 tablet (4 mg total) by  mouth every 8 (eight) hours as needed for nausea or vomiting. 09/03/16   Alvira MondaySchlossman, Erin, MD  penicillin v potassium (VEETID) 500 MG tablet Take 1 tablet (500 mg total) by mouth 4 (four) times daily. 05/01/17 05/08/17  Horton, Mayer Maskerourtney F, MD  sulfamethoxazole-trimethoprim (BACTRIM DS,SEPTRA DS) 800-160 MG per tablet Take 1 tablet by mouth 2 (two) times daily. One po bid x 7 days 02/28/13   Sciacca, Ashok CordiaMarissa, PA-C  trimethoprim-polymyxin b (POLYTRIM) ophthalmic solution Place 1 drop into the left eye every 4 (four) hours. 03/02/16   Emi HolesLaw, Alexandra M, PA-C    Family History History reviewed. No pertinent family history.  Social History Social History  Substance Use Topics  . Smoking status: Current Every Day Smoker  . Smokeless tobacco: Never Used  . Alcohol use Yes     Allergies   Patient has no known allergies.   Review of Systems Review of Systems  Constitutional: Negative for fever.  HENT: Positive for dental problem. Negative for trouble swallowing.   All other systems reviewed and are negative.    Physical Exam Updated Vital Signs BP (!) 145/87 (BP Location: Right Arm)   Pulse 68   Temp 98.5 F (36.9 C) (Oral)   Resp 16   SpO2 98%   Physical Exam  Constitutional: He is oriented to person, place, and time. He appears well-developed and well-nourished. No distress.  HENT:  Head: Normocephalic and atraumatic.  Tenderness to palpation along the gumline of the left upper and lower jaw, no drainable abscess, no significant dental caries noted, no trismus, no fullness noted under the tongue  Neck: Neck supple.  Cardiovascular: Normal rate and regular rhythm.   Pulmonary/Chest: Effort normal and breath sounds normal. No respiratory distress.  Musculoskeletal: He exhibits no edema.  Neurological: He is alert and oriented to person, place, and time.  Skin: Skin is warm and dry.  Psychiatric: He has a normal mood and affect.  Nursing note and vitals reviewed.    ED Treatments /  Results  Labs (all labs ordered are listed, but only abnormal results are displayed) Labs Reviewed - No data to display  EKG  EKG Interpretation None       Radiology No results found.  Procedures Procedures (including critical care time)  Medications Ordered in ED Medications  ketorolac (TORADOL) 30 MG/ML injection 30 mg (not administered)     Initial Impression / Assessment and Plan / ED Course  I have reviewed the triage vital signs and the nursing notes.  Pertinent labs & imaging results that were available during my care of the patient were reviewed by me and considered in my medical decision making (see chart for details).     Patient presents with dental pain. Ongoing waxing and waning for the last 2 months. It appears uncomplicated. He does have a history of HIV. No fevers and vital signs are reassuring. May have underlying infection.  Recommend naproxen and penicillin. Dental resources provided.  After history, exam, and medical workup I feel the patient has been appropriately medically screened and is safe for discharge home. Pertinent diagnoses were discussed with the patient. Patient was given return precautions.   Final Clinical Impressions(s) / ED Diagnoses   Final diagnoses:  Pain, dental    New Prescriptions New Prescriptions   NAPROXEN (NAPROSYN) 500 MG TABLET    Take 1 tablet (500 mg total) by mouth 2 (two) times daily.   PENICILLIN V POTASSIUM (VEETID) 500 MG TABLET    Take 1 tablet (500 mg total) by mouth 4 (four) times daily.     Shon BatonHorton, Courtney F, MD 05/01/17 [phone]

## 2017-05-01 NOTE — Discharge Instructions (Signed)
You were seen today for dental pain. There is no drainable abscess. Take penicillin and naproxen twice daily. Follow-up with dentistry for definitive care. If you develop facial swelling, difficulty swallowing or any new or worsening symptoms you should be reevaluated.

## 2018-11-19 ENCOUNTER — Other Ambulatory Visit: Payer: Self-pay

## 2018-11-19 ENCOUNTER — Encounter (HOSPITAL_COMMUNITY): Payer: Self-pay

## 2018-11-19 ENCOUNTER — Emergency Department (HOSPITAL_COMMUNITY)
Admission: EM | Admit: 2018-11-19 | Discharge: 2018-11-19 | Disposition: A | Discharge status: Home / Self Care | Payer: Self-pay | Attending: Emergency Medicine | Admitting: Emergency Medicine

## 2018-11-19 DIAGNOSIS — R220 Localized swelling, mass and lump, head: Secondary | ICD-10-CM | POA: Insufficient documentation

## 2018-11-19 DIAGNOSIS — Z79899 Other long term (current) drug therapy: Secondary | ICD-10-CM | POA: Insufficient documentation

## 2018-11-19 DIAGNOSIS — K0889 Other specified disorders of teeth and supporting structures: Secondary | ICD-10-CM | POA: Insufficient documentation

## 2018-11-19 DIAGNOSIS — F17210 Nicotine dependence, cigarettes, uncomplicated: Secondary | ICD-10-CM | POA: Insufficient documentation

## 2018-11-19 DIAGNOSIS — B20 Human immunodeficiency virus [HIV] disease: Secondary | ICD-10-CM | POA: Insufficient documentation

## 2018-11-19 MED ORDER — PENICILLIN V POTASSIUM 250 MG PO TABS: 250.0000 mg | ORAL_TABLET | Freq: Four times a day (QID) | ORAL | 0 refills | Status: AC

## 2018-11-19 MED ORDER — NAPROXEN 500 MG PO TABS: 500.0000 mg | ORAL_TABLET | Freq: Two times a day (BID) | ORAL | 0 refills | Status: DC

## 2018-11-19 NOTE — ED Triage Notes (Signed)
Pt reports L sided mouth/face pain onset yesterday evening. Pt tried cold compresses w/ temporary relief. Denies fevers at home

## 2018-11-19 NOTE — ED Provider Notes (Signed)
[facility] EMERGENCY DEPARTMENT Provider Note   CSN: [id number] Arrival date & time: 11/19/18  1907    History   Chief Complaint Chief Complaint  Patient presents with  . Dental Pain    HPI Mark [name] is a 28 y.o. male is here for evaluation of dental pain.  Onset around noon today, 1 hour later he noticed that there was some local swelling.  Other associate symptoms include radiating left-sided headache.  Pain is located to the left lower mandible.  States that he has 2 known broken teeth in this area.  Has been putting ice packs on it without relief.  He denies any fever, eyelid swelling, trismus, trauma.    HPI  Past Medical History:  Diagnosis Date  . Bipolar 1 disorder (HCC)   . Hepatitis C   . HIV (human immunodeficiency virus infection) (HCC)     There are no active problems to display for this patient.   History reviewed. No pertinent surgical history.      Home Medications    Prior to Admission medications   Medication Sig Start Date End Date Taking? Authorizing Provider  azithromycin (ZITHROMAX Z-PAK) 250 MG tablet Take 2 tablets on the first day, and one tablet on days 2 through 5. 03/02/16   Law, Alexandra M, PA-C  benzonatate (TESSALON) 100 MG capsule Take 1 capsule (100 mg total) by mouth 3 (three) times daily as needed for cough. 09/03/16   Alvira Monday, MD  cephALEXin (KEFLEX) 500 MG capsule 2 caps po bid x 7 days 02/28/13   Sciacca, Marissa, PA-C  cetirizine-pseudoephedrine (ZYRTEC-D) 5-120 MG tablet Take 1 tablet by mouth 2 (two) times daily. 03/02/16   Law, Waylan Boga, PA-C  naproxen (NAPROSYN) 500 MG tablet Take 1 tablet (500 mg total) by mouth 2 (two) times daily. 11/19/18   Liberty Handy, PA-C  ondansetron (ZOFRAN ODT) 4 MG disintegrating tablet Take 1 tablet (4 mg total) by mouth every 8 (eight) hours as needed for nausea or vomiting. 09/03/16   Alvira Monday, MD  penicillin v potassium (VEETID) 250 MG tablet Take 1 tablet  (250 mg total) by mouth 4 (four) times daily for 10 days. 11/19/18 11/29/18  Liberty Handy, PA-C  sulfamethoxazole-trimethoprim (BACTRIM DS,SEPTRA DS) 800-160 MG per tablet Take 1 tablet by mouth 2 (two) times daily. One po bid x 7 days 02/28/13   Sciacca, Ashok Cordia, PA-C  trimethoprim-polymyxin b (POLYTRIM) ophthalmic solution Place 1 drop into the left eye every 4 (four) hours. 03/02/16   Emi Holes, PA-C    Family History History reviewed. No pertinent family history.  Social History Social History   Tobacco Use  . Smoking status: Current Every Day Smoker  . Smokeless tobacco: Never Used  Substance Use Topics  . Alcohol use: Yes  . Drug use: Yes    Types: Marijuana     Allergies   Patient has no known allergies.   Review of Systems Review of Systems  HENT: Positive for dental problem and facial swelling.   All other systems reviewed and are negative.    Physical Exam Updated Vital Signs BP (!) 157/78 (BP Location: Right Arm)   Pulse 98   Temp 98.2 F (36.8 C) (Oral)   Resp 18   Ht 6' (1.829 m)   Wt 86.2 kg   SpO2 100%   BMI 25.77 kg/m   Physical Exam Constitutional:      Appearance: He is well-developed. He is not toxic-appearing.  HENT:  Head: Normocephalic.     Right Ear: External ear normal.     Left Ear: External ear normal.     Nose: Nose normal.     Mouth/Throat:     Comments: Last 2 teeth in the left lower side are missing, there is some left over root exposed.  Diffuse focal tenderness around the gumline in this area.  Tenderness to the left lower buccal mucosa.  Mild local left mandibular edema, tenderness.  Moist mucous membranes.  No trismus.  Oropharynx/tonsils and uvula normal.  Normal phonation.  No pooling of oral secretions.  No sublingual edema or fullness.  Normal protrusion of the tongue.  Poor dentition throughout. Eyes:     Conjunctiva/sclera: Conjunctivae normal.  Neck:     Musculoskeletal: Full passive range of motion without  pain.     Comments: No cervical adenopathy.  Trachea is midline.  No anterior neck edema.  Full range of motion of the neck. Cardiovascular:     Rate and Rhythm: Normal rate.  Pulmonary:     Effort: Pulmonary effort is normal. No tachypnea or respiratory distress.  Musculoskeletal: Normal range of motion.  Skin:    General: Skin is warm and dry.     Capillary Refill: Capillary refill takes less than 2 seconds.  Neurological:     Mental Status: He is alert and oriented to person, place, and time.  Psychiatric:        Behavior: Behavior normal.        Thought Content: Thought content normal.      ED Treatments / Results  Labs (all labs ordered are listed, but only abnormal results are displayed) Labs Reviewed - No data to display  EKG None  Radiology No results found.  Procedures Procedures (including critical care time)  Medications Ordered in ED Medications - No data to display   Initial Impression / Assessment and Plan / ED Course  I have reviewed the triage vital signs and the nursing notes.  Pertinent labs & imaging results that were available during my care of the patient were reviewed by me and considered in my medical decision making (see chart for details).       Dental pain and swelling likely associated from poor overall dental health/cracked teeth leading to an early infection.  Afebrile.  Nontoxic.  Swallowing secretions without muffled voice, trismus.  Exam is not concerning for Ludwig's, or other deep facial/neck infection.  We will treat with penicillin, naproxen, warm compresses.  Urged patient to follow-up with dentist.  Patient given dentist contact info, encouraged to follow up in 1-2 days for ultimate management of dental pain and overall dental health. Strict ED return precautions given. Pt is aware of red flag symptoms that would warrant return to ED for re-evaluation and further treatment. Patient voices understanding and is agreeable to  plan.  Final Clinical Impressions(s) / ED Diagnoses   Final diagnoses:  Pain, dental  Facial swelling    ED Discharge Orders         Ordered    penicillin v potassium (VEETID) 250 MG tablet  4 times daily     11/19/18 2013    naproxen (NAPROSYN) 500 MG tablet  2 times daily     11/19/18 2013           Jerrell Mylar 11/19/18 2033    Jacalyn Lefevre, MD 11/19/18 2104

## 2018-11-19 NOTE — Discharge Instructions (Signed)
You were seen in the ER for dental pain.  Pain is likely from broken tooth/exposed nerve endings from cavities that has led to an early infection.   Take 1000 mg of acetaminophen (Tylenol) every 6 hours.  For more pain control take naproxen 500 mg every 12 hours.  Use Orajel 15 to 20 minutes prior to eating or drinking to desensitize nerve endings.  You can use desensitizing toothpastes (Sensodyne, Pronamel, [employer] sensitive) throughout the day to help with nerve pain.  Unfortunately, dental pain will continue until you have a full dental evaluation and treatment.  See dental resources attached.  Additionally, I have given you Dr. Mayford Knife and Dr. Leanord Asal contact information, they frequently try to accommodate patients from the Er.  Return to the ER for fevers, facial or gum line swelling, redness, pus.

## 2019-03-02 ENCOUNTER — Emergency Department (HOSPITAL_COMMUNITY)
Admission: EM | Admit: 2019-03-02 | Discharge: 2019-03-03 | Disposition: A | Discharge status: Home / Self Care | Payer: Self-pay | Attending: Emergency Medicine | Admitting: Emergency Medicine

## 2019-03-02 ENCOUNTER — Emergency Department (HOSPITAL_COMMUNITY): Payer: Self-pay

## 2019-03-02 ENCOUNTER — Other Ambulatory Visit: Payer: Self-pay

## 2019-03-02 DIAGNOSIS — F17200 Nicotine dependence, unspecified, uncomplicated: Secondary | ICD-10-CM | POA: Insufficient documentation

## 2019-03-02 DIAGNOSIS — Z79899 Other long term (current) drug therapy: Secondary | ICD-10-CM | POA: Insufficient documentation

## 2019-03-02 DIAGNOSIS — R404 Transient alteration of awareness: Secondary | ICD-10-CM | POA: Insufficient documentation

## 2019-03-02 DIAGNOSIS — Z21 Asymptomatic human immunodeficiency virus [HIV] infection status: Secondary | ICD-10-CM | POA: Insufficient documentation

## 2019-03-02 LAB — CBC WITH DIFFERENTIAL/PLATELET
Abs Immature Granulocytes: 0.06 10*3/uL (ref 0.00–0.07)
Basophils Absolute: 0 10*3/uL (ref 0.0–0.1)
Basophils Relative: 0 %
Eosinophils Absolute: 0 10*3/uL (ref 0.0–0.5)
Eosinophils Relative: 1 %
HCT: 51.3 % (ref 39.0–52.0)
Hemoglobin: 16.9 g/dL (ref 13.0–17.0)
Immature Granulocytes: 1 %
Lymphocytes Relative: 12 %
Lymphs Abs: 0.9 10*3/uL (ref 0.7–4.0)
MCH: 29.7 pg (ref 26.0–34.0)
MCHC: 32.9 g/dL (ref 30.0–36.0)
MCV: 90.2 fL (ref 80.0–100.0)
Monocytes Absolute: 0.4 10*3/uL (ref 0.1–1.0)
Monocytes Relative: 5 %
Neutro Abs: 6.1 10*3/uL (ref 1.7–7.7)
Neutrophils Relative %: 81 %
Platelets: 236 10*3/uL (ref 150–400)
RBC: 5.69 MIL/uL (ref 4.22–5.81)
RDW: 13 % (ref 11.5–15.5)
WBC: 7.6 10*3/uL (ref 4.0–10.5)
nRBC: 0 % (ref 0.0–0.2)

## 2019-03-02 LAB — COMPREHENSIVE METABOLIC PANEL
ALT: 59 U/L — ABNORMAL HIGH (ref 0–44)
AST: 42 U/L — ABNORMAL HIGH (ref 15–41)
Albumin: 4.3 g/dL (ref 3.5–5.0)
Alkaline Phosphatase: 66 U/L (ref 38–126)
Anion gap: 10 (ref 5–15)
BUN: 8 mg/dL (ref 6–20)
CO2: 25 mmol/L (ref 22–32)
Calcium: 9.1 mg/dL (ref 8.9–10.3)
Chloride: 102 mmol/L (ref 98–111)
Creatinine, Ser: 1.1 mg/dL (ref 0.61–1.24)
GFR calc Af Amer: >60 mL/min (ref >60)
GFR calc non Af Amer: >60 mL/min (ref >60)
Glucose, Bld: 126 mg/dL — ABNORMAL HIGH (ref 70–99)
Potassium: 3.3 mmol/L — ABNORMAL LOW (ref 3.5–5.1)
Sodium: 137 mmol/L (ref 135–145)
Total Bilirubin: 0.6 mg/dL (ref 0.3–1.2)
Total Protein: 8.7 g/dL — ABNORMAL HIGH (ref 6.5–8.1)

## 2019-03-02 LAB — ETHANOL: Alcohol, Ethyl (B): <10 mg/dL (ref <10)

## 2019-03-02 MED ORDER — ONDANSETRON HCL 4 MG/2ML IJ SOLN: 4.0000 mg | Freq: Once | INTRAMUSCULAR | Status: DC

## 2019-03-02 MED ORDER — METOCLOPRAMIDE HCL 5 MG/ML IJ SOLN
10.0000 mg | Freq: Once | INTRAMUSCULAR | Status: AC
  Administered 2019-03-02: 10 mg via INTRAVENOUS
  Filled 2019-03-02: qty 2

## 2019-03-02 NOTE — ED Notes (Signed)
Patient transported to CT 

## 2019-03-02 NOTE — ED Notes (Addendum)
Pt made aware that urine sample is needed. Urinal left at bedside

## 2019-03-02 NOTE — ED Provider Notes (Signed)
[facility]-EMERGENCY DEPT Provider Note   CSN: [id number] Arrival date & time: 03/02/19  1900    History   Chief Complaint No chief complaint on file.   HPI Mark [name] is a 28 y.o. adult.      Patient with history of HIV, bipolar disorder presents from home with reported complaint of headache, altered mental status.  Level 5 caveat as patient is not conversant and is not cooperative with history.  History from EMS.  Family reported that patient had a migraine, had vomiting, and was unresponsive for them.  They called the ambulance.  Patient reportedly fell into a wall.  Unknown treatments prior to arrival.     Past Medical History:  Diagnosis Date   Bipolar 1 disorder (HCC)    Hepatitis C    HIV (human immunodeficiency virus infection) (HCC)     There are no active problems to display for this patient.   No past surgical history on file.      Home Medications    Prior to Admission medications   Medication Sig Start Date End Date Taking? Authorizing Provider  azithromycin (ZITHROMAX Z-PAK) 250 MG tablet Take 2 tablets on the first day, and one tablet on days 2 through 5. 03/02/16   Law, Alexandra M, PA-C  benzonatate (TESSALON) 100 MG capsule Take 1 capsule (100 mg total) by mouth 3 (three) times daily as needed for cough. 09/03/16   Alvira Monday, MD  cephALEXin (KEFLEX) 500 MG capsule 2 caps po bid x 7 days 02/28/13   Sciacca, Marissa, PA-C  cetirizine-pseudoephedrine (ZYRTEC-D) 5-120 MG tablet Take 1 tablet by mouth 2 (two) times daily. 03/02/16   Law, Waylan Boga, PA-C  naproxen (NAPROSYN) 500 MG tablet Take 1 tablet (500 mg total) by mouth 2 (two) times daily. 11/19/18   Liberty Handy, PA-C  ondansetron (ZOFRAN ODT) 4 MG disintegrating tablet Take 1 tablet (4 mg total) by mouth every 8 (eight) hours as needed for nausea or vomiting. 09/03/16   Alvira Monday, MD  sulfamethoxazole-trimethoprim (BACTRIM DS,SEPTRA DS) 800-160 MG per tablet  Take 1 tablet by mouth 2 (two) times daily. One po bid x 7 days 02/28/13   Sciacca, Ashok Cordia, PA-C  trimethoprim-polymyxin b (POLYTRIM) ophthalmic solution Place 1 drop into the left eye every 4 (four) hours. 03/02/16   Emi Holes, PA-C    Family History No family history on file.  Social History Social History   Tobacco Use   Smoking status: Current Every Day Smoker   Smokeless tobacco: Never Used  Substance Use Topics   Alcohol use: Yes   Drug use: Yes    Types: Marijuana     Allergies   Patient has no known allergies.   Review of Systems Review of Systems  Unable to perform ROS: Mental status change     Physical Exam Updated Vital Signs SpO2 100%   Physical Exam Vitals signs and nursing note reviewed.  Constitutional:      Appearance: She is well-developed.     Comments: Patient is lying on his side in the bed. She will not answer questions.  She does moan softly after questions are asked.  HENT:     Head: Normocephalic and atraumatic.     Right Ear: Tympanic membrane, ear canal and external ear normal.     Left Ear: Tympanic membrane, ear canal and external ear normal.     Nose: Nose normal.     Mouth/Throat:     Pharynx: Uvula midline.  Eyes:     General: Lids are normal.     Conjunctiva/sclera: Conjunctivae normal.     Pupils: Pupils are equal, round, and reactive to light.     Comments: Pupils are normal.  Neck:     Musculoskeletal: Normal range of motion and neck supple.  Cardiovascular:     Rate and Rhythm: Normal rate and regular rhythm.  Pulmonary:     Effort: Pulmonary effort is normal.     Breath sounds: Normal breath sounds.  Abdominal:     Palpations: Abdomen is soft.     Tenderness: There is no abdominal tenderness.  Musculoskeletal: Normal range of motion.     Cervical back: She exhibits normal range of motion, no tenderness and no bony tenderness.  Skin:    General: Skin is warm and dry.  Neurological:     GCS: GCS eye subscore  is 4. GCS verbal subscore is 5. GCS motor subscore is 6.     Motor: No abnormal muscle tone.     Deep Tendon Reflexes: Reflexes are normal and symmetric.     Comments: Unable to complete neurological exam due to patient uncooperative.  Patient has good muscle tone and spontaneous movement of all extremities however.      ED Treatments / Results  Labs (all labs ordered are listed, but only abnormal results are displayed) Labs Reviewed  COMPREHENSIVE METABOLIC PANEL - Abnormal; Notable for the following components:      Result Value   Potassium 3.3 (*)    Glucose, Bld 126 (*)    Total Protein 8.7 (*)    AST 42 (*)    ALT 59 (*)    All other components within normal limits  CBC WITH DIFFERENTIAL/PLATELET  ETHANOL  RAPID URINE DRUG SCREEN, HOSP PERFORMED    EKG None  Radiology Ct Head Wo Contrast  Result Date: 03/02/2019 CLINICAL DATA:  Mental status changes. EXAM: CT HEAD WITHOUT CONTRAST CT CERVICAL SPINE WITHOUT CONTRAST TECHNIQUE: Multidetector CT imaging of the head and cervical spine was performed following the standard protocol without intravenous contrast. Multiplanar CT image reconstructions of the cervical spine were also generated. COMPARISON:  Head CT 06/20/2006 FINDINGS: CT HEAD FINDINGS Brain: The ventricles are normal in size and configuration. No extra-axial fluid collections are identified. The gray-white differentiation is maintained. No CT findings for acute hemispheric infarction or intracranial hemorrhage. No mass lesions. The brainstem and cerebellum are normal. Vascular: No hyperdense vessels or obvious aneurysm. Skull: No acute skull fracture.  No bone lesion. Sinuses/Orbits: The paranasal sinuses and mastoid air cells are clear. The globes are intact. Other: No scalp lesions, laceration or hematoma. CT CERVICAL SPINE FINDINGS Alignment: Normal. Straightening of the normal cervical lordosis likely due to positioning. Skull base and vertebrae: No acute fracture. No  primary bone lesion or focal pathologic process. Soft tissues and spinal canal: No prevertebral fluid or swelling. No visible canal hematoma. Disc levels: The spinal canal is generous. No spinal or foraminal stenosis. Upper chest: The lung apices are clear. Other: No significant findings.  Scattered bilateral neck nodes. IMPRESSION: 1. Normal head CT. 2. Normal cervical spine CT scan. Electronically Signed   By: Rudie Meyer M.D.   On: 03/02/2019 20:25   Ct Cervical Spine Wo Contrast  Result Date: 03/02/2019 CLINICAL DATA:  Mental status changes. EXAM: CT HEAD WITHOUT CONTRAST CT CERVICAL SPINE WITHOUT CONTRAST TECHNIQUE: Multidetector CT imaging of the head and cervical spine was performed following the standard protocol without intravenous contrast. Multiplanar CT image  reconstructions of the cervical spine were also generated. COMPARISON:  Head CT 06/20/2006 FINDINGS: CT HEAD FINDINGS Brain: The ventricles are normal in size and configuration. No extra-axial fluid collections are identified. The gray-white differentiation is maintained. No CT findings for acute hemispheric infarction or intracranial hemorrhage. No mass lesions. The brainstem and cerebellum are normal. Vascular: No hyperdense vessels or obvious aneurysm. Skull: No acute skull fracture.  No bone lesion. Sinuses/Orbits: The paranasal sinuses and mastoid air cells are clear. The globes are intact. Other: No scalp lesions, laceration or hematoma. CT CERVICAL SPINE FINDINGS Alignment: Normal. Straightening of the normal cervical lordosis likely due to positioning. Skull base and vertebrae: No acute fracture. No primary bone lesion or focal pathologic process. Soft tissues and spinal canal: No prevertebral fluid or swelling. No visible canal hematoma. Disc levels: The spinal canal is generous. No spinal or foraminal stenosis. Upper chest: The lung apices are clear. Other: No significant findings.  Scattered bilateral neck nodes. IMPRESSION: 1.  Normal head CT. 2. Normal cervical spine CT scan. Electronically Signed   By: Rudie MeyerP.  Gallerani M.D.   On: 03/02/2019 20:25    Procedures Procedures (including critical care time)  Medications Ordered in ED Medications  metoCLOPramide (REGLAN) injection 10 mg (10 mg Intravenous Given 03/02/19 1948)     Initial Impression / Assessment and Plan / ED Course  I have reviewed the triage vital signs and the nursing notes.  Pertinent labs & imaging results that were available during my care of the patient were reviewed by me and considered in my medical decision making (see chart for details).        Patient seen and examined.  Patient is not cooperative with exam.  It is unclear if he is encephalopathic at this time or just not cooperative.  He has a history of immunocompromise with HIV and I do not see any recent CD4 counts.  In addition, he does have bipolar disorder and history of behavioral issues per EMS history.  Imaging, labs ordered.  Will treat headache with Reglan.  Vital signs reviewed and are as follows: BP (!) 133/56    Pulse (!) 57    Temp 98.8 F (37.1 C) (Rectal)    Resp 15    SpO2 100%   9:18 PM No fever. Reviewed CT head personally. Pt rechecked. Will open eyes but still not answering questions. Discussed with Dr. Jeraldine LootsLockwood who will see.   10:35 PM Pt seen by Dr. Jeraldine LootsLockwood. Plan is for continued monitoring at this point.   11:54 PM Patient rechecked. She is awake. Does not remember how she got her. Denies pain. States she smokes weed. Remembers going home today, vomiting, felt lightheaded. Will fluid challenge and ambulate. We discussed lab and imaging results.   12:44 AM patient ambulatory, drinking without any issues.  She continues to state that she is feeling better.  Plan to discharge home.  Questions answered.  Encouraged close PCP follow-up for management of chronic issues including HIV.  Encouraged return to the emergency department with additional episodes of  confusion, passing out, other concerns.  Final Clinical Impressions(s) / ED Diagnoses   Final diagnoses:  Transient alteration of awareness   Patient with altered mental status starting earlier today.  Patient was awake and looking around during ED stay, moaning at times, but nonconversant for several hours.  No signs of seizure activity.  CT of the head and cervical spine were checked and were negative.  Basic lab work is negative.  Negative alcohol.  Patient eventually woke up and started talking.  They do not remember events from earlier today apparently got to the hospital.  Patient does have a history of HIV.  No fevers or other systemic symptoms.  No meningismus.  No ongoing encephalopathy.  Do not feel patient requires lumbar puncture at this time.  Patient also has some underlying psychiatric illness and substance abuse which may be contributing.  She has returned to baseline and is ready for discharge to home.   ED Discharge Orders    None       Renne Crigler, Cordelia Poche 03/03/19 0049    Gerhard Munch, MD 03/03/19 1655

## 2019-03-02 NOTE — ED Triage Notes (Signed)
Per EMS - family called EMS because pt came home and was unresponsive to them. Per family, "pt has migraine and seems stressed out". Hx of anxiety, and behavioral issues per family. Pt only responsive to pain. Not talking but spontaneous eye opening and movement. Pt vomited on scene with EMS. Was in C collar due to reported fall into wall, but pt removed.   18 Lt AC  CBG 117 142/68 HR 56 R 14 100% RA

## 2019-03-02 NOTE — ED Notes (Signed)
Pt was able to shake head to answer minimal questions, but will not verbalized any answers.

## 2019-03-02 NOTE — ED Notes (Signed)
Bed: WA08 Expected date:  Expected time:  Means of arrival:  Comments: EMS-headache

## 2019-03-03 NOTE — ED Notes (Signed)
Pt ambulated with steady gait and was given water to drink.

## 2019-03-03 NOTE — Discharge Instructions (Signed)
Please read and follow all provided instructions.  Your diagnoses today include:  1. Transient alteration of awareness     Tests performed today include:  CT of your head and neck which was normal and did not show any serious cause of your headache  Blood counts and electrolytes  Vital signs. See below for your results today.   Medications:  In the Emergency Department you received:  Reglan - antinausea/headache medication  Take any prescribed medications only as directed.  Additional information:  Follow any educational materials contained in this packet.  You are having a headache. No specific cause was found today for your headache. It may have been a migraine or other cause of headache. Stress, anxiety, fatigue, and depression are common triggers for headaches.   Your headache today does not appear to be life-threatening or require hospitalization, but often the exact cause of headaches is not determined in the emergency department. Therefore, follow-up with your doctor is very important to find out what may have caused your headache and whether or not you need any further diagnostic testing or treatment.   Sometimes headaches can appear benign (not harmful), but then more serious symptoms can develop which should prompt an immediate re-evaluation by your doctor or the emergency department.  BE VERY CAREFUL not to take multiple medicines containing Tylenol (also called acetaminophen). Doing so can lead to an overdose which can damage your liver and cause liver failure and possibly death.   Follow-up instructions: Please follow-up with your primary care provider in the next 7 days for further evaluation of your symptoms.   Return instructions:   Please return to the Emergency Department if you experience worsening symptoms.  Return if the medications do not resolve your headache, if it recurs, or if you have multiple episodes of vomiting or cannot keep down fluids.  Return  if you have a change from the usual headache.  RETURN IMMEDIATELY IF you:  Develop a sudden, severe headache  Develop confusion or become poorly responsive or faint  Develop a fever above 100.2F or problem breathing  Have a change in speech, vision, swallowing, or understanding  Develop new weakness, numbness, tingling, incoordination in your arms or legs  Have a seizure  Please return if you have any other emergent concerns.  Additional Information:  Your vital signs today were: BP (!) 138/54    Pulse (!) 57    Temp 98.8 F (37.1 C) (Rectal)    Resp 15    SpO2 100%  If your blood pressure (BP) was elevated above 135/85 this visit, please have this repeated by your doctor within one month. --------------

## 2019-05-13 ENCOUNTER — Other Ambulatory Visit: Payer: Self-pay

## 2019-05-13 ENCOUNTER — Ambulatory Visit (INDEPENDENT_AMBULATORY_CARE_PROVIDER_SITE_OTHER): Payer: Self-pay

## 2019-05-13 ENCOUNTER — Encounter (HOSPITAL_COMMUNITY): Payer: Self-pay | Admitting: Emergency Medicine

## 2019-05-13 ENCOUNTER — Ambulatory Visit (HOSPITAL_COMMUNITY)
Admission: EM | Admit: 2019-05-13 | Discharge: 2019-05-13 | Disposition: A | Discharge status: Home / Self Care | Payer: Self-pay | Attending: Family Medicine | Admitting: Family Medicine

## 2019-05-13 DIAGNOSIS — R05 Cough: Secondary | ICD-10-CM

## 2019-05-13 DIAGNOSIS — B1920 Unspecified viral hepatitis C without hepatic coma: Secondary | ICD-10-CM | POA: Insufficient documentation

## 2019-05-13 DIAGNOSIS — B20 Human immunodeficiency virus [HIV] disease: Secondary | ICD-10-CM

## 2019-05-13 DIAGNOSIS — J40 Bronchitis, not specified as acute or chronic: Secondary | ICD-10-CM | POA: Insufficient documentation

## 2019-05-13 DIAGNOSIS — R059 Cough, unspecified: Secondary | ICD-10-CM

## 2019-05-13 DIAGNOSIS — Z20828 Contact with and (suspected) exposure to other viral communicable diseases: Secondary | ICD-10-CM | POA: Insufficient documentation

## 2019-05-13 DIAGNOSIS — F17200 Nicotine dependence, unspecified, uncomplicated: Secondary | ICD-10-CM | POA: Insufficient documentation

## 2019-05-13 DIAGNOSIS — K920 Hematemesis: Secondary | ICD-10-CM | POA: Insufficient documentation

## 2019-05-13 DIAGNOSIS — F319 Bipolar disorder, unspecified: Secondary | ICD-10-CM | POA: Insufficient documentation

## 2019-05-13 MED ORDER — BENZONATATE 200 MG PO CAPS: 200.0000 mg | ORAL_CAPSULE | Freq: Three times a day (TID) | ORAL | 0 refills | Status: AC | PRN

## 2019-05-13 MED ORDER — AZITHROMYCIN 250 MG PO TABS: 250.0000 mg | ORAL_TABLET | Freq: Every day | ORAL | 0 refills | Status: DC

## 2019-05-13 NOTE — ED Triage Notes (Signed)
Pt sts cough with small amount of blood this am and then vomited with larger amount of blood noted per pt; pt sts hx of HIV dx and currently not taking any antivirals

## 2019-05-13 NOTE — Discharge Instructions (Addendum)
Please follow up with infectious disease to restart HIV meds- contact below  Tessalon as needed every 8 hours for cough Azithromycin- 2 tabs today, 1 tab for the following 4 days  If having another episode of true bloody vomit or develop chest or abdominal pain, increased lightheadedness or weakness to follow up in the emergency room

## 2019-05-13 NOTE — ED Provider Notes (Signed)
[facility]    CSN: [id number] Arrival date & time: 05/13/19  1722      History   Chief Complaint Chief Complaint  Patient presents with  . Hematemesis    HPI Mark [name] is a [age] y.o. adult history of HIV, Bipolar disorder, and Hepatitis C presenting today for evaluation of cough. He has had a cough for 1.5 weeks.  This morning upon waking he coughed up mucus that was streaked with blood. He states later this morning this led to an episode of vomiting blood. He states he vomited bright red appearing substances. He is unable to say if it was frank blood, bile, mucous, food. He does note he drank blue kool-aid prior to this. He has continued to feel slightly nauseous but denies continued vomiting/bleeding. Denies blood from nose. Denies chest pain, shortness of breath. Denies abdominal pain. Denies changes in bowels. Bowels without blood. Last normal bowel movement this morning. Denies fevers, chills, body aches. Denies exposure to COVID.  Of note patient has also been off his HIV medicine for at least the past 6 months. States he was unable to pay copay to follow up with ID. He is unsure of the medicine he was on.   HPI  Past Medical History:  Diagnosis Date  . Bipolar 1 disorder (HCC)   . Hepatitis C   . HIV (human immunodeficiency virus infection) (HCC)     There are no active problems to display for this patient.   History reviewed. No pertinent surgical history.     Home Medications    Prior to Admission medications   Medication Sig Start Date End Date Taking? Authorizing Provider  azithromycin (ZITHROMAX) 250 MG tablet Take 1 tablet (250 mg total) by mouth daily. Take first 2 tablets together, then 1 every day until finished. 05/13/19   Wieters, Hallie C, PA-C  benzonatate (TESSALON) 200 MG capsule Take 1 capsule (200 mg total) by mouth 3 (three) times daily as needed for up to 7 days for cough. 05/13/19 05/20/19  Wieters, Junius CreamerHallie C, PA-C    Family History  History reviewed. No pertinent family history.  Social History Social History   Tobacco Use  . Smoking status: Current Every Day Smoker  . Smokeless tobacco: Never Used  Substance Use Topics  . Alcohol use: Yes  . Drug use: Yes    Types: Marijuana     Allergies   Patient has no known allergies.   Review of Systems Review of Systems  Constitutional: Negative for activity change, appetite change, chills, fatigue and fever.  HENT: Negative for congestion, ear pain, rhinorrhea, sinus pressure, sore throat and trouble swallowing.   Eyes: Negative for discharge and redness.  Respiratory: Positive for cough. Negative for chest tightness and shortness of breath.   Cardiovascular: Negative for chest pain.  Gastrointestinal: Positive for nausea and vomiting. Negative for abdominal pain and diarrhea.  Musculoskeletal: Negative for myalgias.  Skin: Negative for rash.  Neurological: Positive for light-headedness. Negative for dizziness and headaches.     Physical Exam Triage Vital Signs ED Triage Vitals  Enc Vitals Group     BP 05/13/19 1742 (!) 144/85     Pulse Rate 05/13/19 1742 97     Resp 05/13/19 1742 18     Temp 05/13/19 1742 98.7 F (37.1 C)     Temp Source 05/13/19 1742 Oral     SpO2 05/13/19 1742 95 %     Weight --      Height --  Head Circumference --      Peak Flow --      Pain Score 05/13/19 1743 0     Pain Loc --      Pain Edu? --      Excl. in GC? --    No data found.  Updated Vital Signs BP (!) 144/85 (BP Location: Right Arm)   Pulse 97   Temp 98.7 F (37.1 C) (Oral)   Resp 18   SpO2 95%   Visual Acuity Right Eye Distance:   Left Eye Distance:   Bilateral Distance:    Right Eye Near:   Left Eye Near:    Bilateral Near:     Physical Exam Vitals signs and nursing note reviewed.  Constitutional:      General: She is not in acute distress.    Appearance: She is well-developed.  HENT:     Head: Normocephalic and atraumatic.     Ears:      Comments: Bilateral ears without tenderness to palpation of external auricle, tragus and mastoid, EAC's without erythema or swelling, TM's with good bony landmarks and cone of light. Non erythematous.     Nose:     Comments: No rhinnorea    Mouth/Throat:     Comments: Oral mucosa pink and moist, no tonsillar enlargement or exudate. Posterior pharynx patent and nonerythematous, no uvula deviation or swelling. Normal phonation.  Eyes:     Extraocular Movements: Extraocular movements intact.     Conjunctiva/sclera: Conjunctivae normal.     Pupils: Pupils are equal, round, and reactive to light.  Neck:     Musculoskeletal: Neck supple.  Cardiovascular:     Rate and Rhythm: Normal rate and regular rhythm.     Heart sounds: No murmur.  Pulmonary:     Effort: Pulmonary effort is normal. No respiratory distress.     Breath sounds: Normal breath sounds.     Comments: Breathing comfortably at rest, CTABL, no wheezing, rales or other adventitious sounds auscultated  Nontender to anterior chest Abdominal:     Palpations: Abdomen is soft.     Tenderness: There is no abdominal tenderness.     Comments: nontender to light and deep palpation throughout abdomen  Skin:    General: Skin is warm and dry.  Neurological:     General: No focal deficit present.     Mental Status: She is alert and oriented to person, place, and time.      UC Treatments / Results  Labs (all labs ordered are listed, but only abnormal results are displayed) Labs Reviewed  NOVEL CORONAVIRUS, NAA (HOSPITAL ORDER, SEND-OUT TO REF LAB)    EKG   Radiology Dg Chest 2 View  Result Date: 05/13/2019 CLINICAL DATA:  Cough over the last 10 days. Hemoptysis today. HIV positive. EXAM: CHEST - 2 VIEW COMPARISON:  09/03/2016 FINDINGS: Mild central bronchial thickening suggests bronchitis. No infiltrate, collapse or effusion. No bone abnormality. IMPRESSION: Bronchitis pattern.  No consolidation or collapse. Electronically  Signed   By: Paulina FusiMark  Shogry M.D.   On: 05/13/2019 19:23    Procedures Procedures (including critical care time)  Medications Ordered in UC Medications - No data to display  Initial Impression / Assessment and Plan / UC Course  I have reviewed the triage vital signs and the nursing notes.  Pertinent labs & imaging results that were available during my care of the patient were reviewed by me and considered in my medical decision making (see chart for details).  Attempted to discern specific detail regarding if patient was having true hematemesis.Discussed concerns with patient and symptoms seemed to be related to coughing/respiratory symptoms, VSS, without pain and no acute distress so opted for initial evaluation at urgent care. Chest xray obtained and suggestive of bronchitis. Given HIV status, length of symptoms will still cover atypicals with azithromycin, tessalon for cough.  COVID pending. Rest, fluids. Follow up in ED if symptoms worsening developing pain, fevers or having another episode of "bloody vomit" or getting light headed/dizzy. Patient does seem stable for discharge with close monitoring.   Patient to call infectious disease in morning to re-establish care and work towards getting back on meds.   Discussed strict return precautions. Patient verbalized understanding and is agreeable with plan.  Final Clinical Impressions(s) / UC Diagnoses   Final diagnoses:  Cough  Human immunodeficiency virus (HIV) disease ([place])     Discharge Instructions     Please follow up with infectious disease to restart HIV meds- contact below  Tessalon as needed every 8 hours for cough Azithromycin- 2 tabs today, 1 tab for the following 4 days  If having another episode of true bloody vomit or develop chest or abdominal pain, increased lightheadedness or weakness to follow up in the emergency room   ED Prescriptions    Medication Sig Dispense Auth. Provider   azithromycin (ZITHROMAX)  250 MG tablet Take 1 tablet (250 mg total) by mouth daily. Take first 2 tablets together, then 1 every day until finished. 6 tablet Wieters, Hallie C, PA-C   benzonatate (TESSALON) 200 MG capsule Take 1 capsule (200 mg total) by mouth 3 (three) times daily as needed for up to 7 days for cough. 28 capsule Wieters, Hallie C, PA-C     Controlled Substance Prescriptions [place] Controlled Substance Registry consulted? Not Applicable   Janith Lima, [place] 05/13/19 2304

## 2019-05-15 LAB — NOVEL CORONAVIRUS, NAA (HOSP ORDER, SEND-OUT TO REF LAB; TAT 18-24 HRS): SARS-CoV-2, NAA: NOT DETECTED

## 2019-05-17 ENCOUNTER — Encounter (HOSPITAL_COMMUNITY): Payer: Self-pay

## 2019-07-01 ENCOUNTER — Encounter (HOSPITAL_COMMUNITY): Payer: Self-pay | Admitting: Emergency Medicine

## 2019-07-01 ENCOUNTER — Other Ambulatory Visit: Payer: Self-pay

## 2019-07-01 ENCOUNTER — Emergency Department (HOSPITAL_COMMUNITY)
Admission: EM | Admit: 2019-07-01 | Discharge: 2019-07-01 | Disposition: A | Discharge status: Home / Self Care | Payer: Self-pay | Attending: Emergency Medicine | Admitting: Emergency Medicine

## 2019-07-01 ENCOUNTER — Emergency Department (HOSPITAL_COMMUNITY): Payer: Self-pay

## 2019-07-01 DIAGNOSIS — Z21 Asymptomatic human immunodeficiency virus [HIV] infection status: Secondary | ICD-10-CM | POA: Insufficient documentation

## 2019-07-01 DIAGNOSIS — F17200 Nicotine dependence, unspecified, uncomplicated: Secondary | ICD-10-CM | POA: Insufficient documentation

## 2019-07-01 DIAGNOSIS — R5383 Other fatigue: Secondary | ICD-10-CM | POA: Insufficient documentation

## 2019-07-01 DIAGNOSIS — R531 Weakness: Secondary | ICD-10-CM | POA: Insufficient documentation

## 2019-07-01 DIAGNOSIS — F319 Bipolar disorder, unspecified: Secondary | ICD-10-CM | POA: Insufficient documentation

## 2019-07-01 LAB — URINALYSIS, ROUTINE W REFLEX MICROSCOPIC
Bacteria, UA: NONE SEEN
Bilirubin Urine: NEGATIVE
Glucose, UA: NEGATIVE mg/dL
Ketones, ur: 5 mg/dL — AB
Leukocytes,Ua: NEGATIVE
Nitrite: NEGATIVE
Protein, ur: 30 mg/dL — AB
Specific Gravity, Urine: 1.028 (ref 1.005–1.030)
pH: 7 (ref 5.0–8.0)

## 2019-07-01 LAB — COMPREHENSIVE METABOLIC PANEL
ALT: 66 U/L — ABNORMAL HIGH (ref 0–44)
AST: 63 U/L — ABNORMAL HIGH (ref 15–41)
Albumin: 4.8 g/dL (ref 3.5–5.0)
Alkaline Phosphatase: 70 U/L (ref 38–126)
Anion gap: 11 (ref 5–15)
BUN: 11 mg/dL (ref 6–20)
CO2: 25 mmol/L (ref 22–32)
Calcium: 9.6 mg/dL (ref 8.9–10.3)
Chloride: 100 mmol/L (ref 98–111)
Creatinine, Ser: 1.11 mg/dL (ref 0.61–1.24)
GFR calc Af Amer: >60 mL/min (ref >60)
GFR calc non Af Amer: >60 mL/min (ref >60)
Glucose, Bld: 138 mg/dL — ABNORMAL HIGH (ref 70–99)
Potassium: 3.7 mmol/L (ref 3.5–5.1)
Sodium: 136 mmol/L (ref 135–145)
Total Bilirubin: 1.3 mg/dL — ABNORMAL HIGH (ref 0.3–1.2)
Total Protein: 9.2 g/dL — ABNORMAL HIGH (ref 6.5–8.1)

## 2019-07-01 LAB — CBC
HCT: 52.9 % — ABNORMAL HIGH (ref 39.0–52.0)
Hemoglobin: 17.6 g/dL — ABNORMAL HIGH (ref 13.0–17.0)
MCH: 30 pg (ref 26.0–34.0)
MCHC: 33.3 g/dL (ref 30.0–36.0)
MCV: 90.1 fL (ref 80.0–100.0)
Platelets: 250 10*3/uL (ref 150–400)
RBC: 5.87 MIL/uL — ABNORMAL HIGH (ref 4.22–5.81)
RDW: 12.7 % (ref 11.5–15.5)
WBC: 5.6 10*3/uL (ref 4.0–10.5)
nRBC: 0 % (ref 0.0–0.2)

## 2019-07-01 LAB — RAPID URINE DRUG SCREEN, HOSP PERFORMED
Amphetamines: NOT DETECTED
Barbiturates: NOT DETECTED
Benzodiazepines: NOT DETECTED
Cocaine: NOT DETECTED
Opiates: NOT DETECTED
Tetrahydrocannabinol: POSITIVE — AB

## 2019-07-01 LAB — CBG MONITORING, ED: Glucose-Capillary: 158 mg/dL — ABNORMAL HIGH (ref 70–99)

## 2019-07-01 LAB — ETHANOL: Alcohol, Ethyl (B): <10 mg/dL (ref <10)

## 2019-07-01 LAB — LIPASE, BLOOD: Lipase: 25 U/L (ref 11–51)

## 2019-07-01 LAB — AMMONIA: Ammonia: 22 umol/L (ref 9–35)

## 2019-07-01 MED ORDER — METOCLOPRAMIDE HCL 5 MG/ML IJ SOLN
5.0000 mg | Freq: Once | INTRAMUSCULAR | Status: AC
  Administered 2019-07-01: 5 mg via INTRAVENOUS
  Filled 2019-07-01: qty 2

## 2019-07-01 MED ORDER — ONDANSETRON 4 MG PO TBDP
4.0000 mg | ORAL_TABLET | Freq: Once | ORAL | Status: DC | PRN
  Filled 2019-07-01: qty 1

## 2019-07-01 MED ORDER — SODIUM CHLORIDE 0.9 % IV SOLN: INTRAVENOUS | Status: DC

## 2019-07-01 NOTE — ED Notes (Signed)
As patient being transported to CT, patient experienced nausea and vomited green and yellow bile on the floor.

## 2019-07-01 NOTE — ED Notes (Signed)
Pt keeps removing equipment, RN had to reapply pulse ox x 2.

## 2019-07-01 NOTE — ED Triage Notes (Signed)
Per [facility] pt from home for abd pains with n/v/d that started earlier today. Pt won't respond to Triage questions at this time.

## 2019-07-01 NOTE — ED Notes (Signed)
Patient transported to CT scan . 

## 2019-07-01 NOTE — ED Notes (Addendum)
Screener called and said they needed assistance in the lobby. Patient had just had blood drawn and was walking around moaning and then laid himself in the floor. This RN went out to lobby and patient had lay himself down in the lobby on the floor. Patient was acting similarly with EMS once he was told he had to go to triage.  Patient is choosing only to respond when he wants to.  Phlebotomist tried to draw blood and patient stated "Nu uh" PA came out and told nursing staff he had to be put in a room.  Patient got himself out of chair and lay down in floor.

## 2019-07-01 NOTE — ED Notes (Signed)
Pt verbalizes understanding of DC instructions. Pt belongings returned and is ambulatory out of ED.  

## 2019-07-01 NOTE — ED Notes (Signed)
Patient refuses to verbalize complaints or answer any questions verbally for this writer or MD Zenia Resides. Patient moans, opens eyes to command. Patient updated by MD Zenia Resides on plan of care. This RN started PIV. Labs drawn and sent.

## 2019-07-01 NOTE — ED Provider Notes (Signed)
[facility] DEPT Provider Note   CSN: [id number] Arrival date & time: 07/01/19  1105     History   Chief Complaint Chief Complaint  Patient presents with  . Emesis  . Diarrhea  . Abdominal Pain    HPI Alexey Rhoads is a 28 y.o. adult.     28 year old male who presents via EMS due to abdominal pain with nausea vomiting diarrhea.  Patient uncooperative and had similar episode back in June of this year.  He does have a history of bipolar disorder, hep C as well as HIV.  Patient uncooperative with history and exam at this time.     Past Medical History:  Diagnosis Date  . Bipolar 1 disorder ([place])   . Hepatitis C   . HIV (human immunodeficiency virus infection) ([place])     There are no active problems to display for this patient.   History reviewed. No pertinent surgical history.      Home Medications    Prior to Admission medications   Medication Sig Start Date End Date Taking? Authorizing Provider  azithromycin (ZITHROMAX) 250 MG tablet Take 1 tablet (250 mg total) by mouth daily. Take first 2 tablets together, then 1 every day until finished. 05/13/19   Wieters, Elesa Hacker, PA-C    Family History No family history on file.  Social History Social History   Tobacco Use  . Smoking status: Current Every Day Smoker  . Smokeless tobacco: Never Used  Substance Use Topics  . Alcohol use: Yes  . Drug use: Yes    Types: Marijuana     Allergies   Patient has no known allergies.   Review of Systems Review of Systems  Unable to perform ROS: Acuity of condition     Physical Exam Updated Vital Signs BP (!) 126/113   Pulse 91   Resp 19   SpO2 100%   Physical Exam Vitals signs and nursing note reviewed.  Constitutional:      General: She is not in acute distress.    Appearance: Normal appearance. She is well-developed. She is not toxic-appearing.  HENT:     Head: Normocephalic and atraumatic.  Eyes:     General: Lids are  normal.     Conjunctiva/sclera: Conjunctivae normal.     Pupils: Pupils are equal, round, and reactive to light.  Neck:     Musculoskeletal: Normal range of motion and neck supple.     Thyroid: No thyroid mass.     Trachea: No tracheal deviation.  Cardiovascular:     Rate and Rhythm: Normal rate and regular rhythm.     Heart sounds: Normal heart sounds. No murmur. No gallop.   Pulmonary:     Effort: Pulmonary effort is normal. No respiratory distress.     Breath sounds: Normal breath sounds. No stridor. No decreased breath sounds, wheezing, rhonchi or rales.  Abdominal:     General: Bowel sounds are normal. There is no distension.     Palpations: Abdomen is soft.     Tenderness: There is no abdominal tenderness. There is no rebound.  Musculoskeletal: Normal range of motion.        General: No tenderness.  Skin:    General: Skin is warm and dry.     Findings: No abrasion or rash.  Neurological:     Mental Status: She is lethargic and disoriented.     GCS: GCS eye subscore is 4. GCS verbal subscore is 3. GCS motor subscore is 6.  Sensory: No sensory deficit.     Comments: Patient withdraws to pain in all 4 extremities.  Psychiatric:        Attention and Perception: She is inattentive.      ED Treatments / Results  Labs (all labs ordered are listed, but only abnormal results are displayed) Labs Reviewed  LIPASE, BLOOD  COMPREHENSIVE METABOLIC PANEL  CBC  URINALYSIS, ROUTINE W REFLEX MICROSCOPIC  RAPID URINE DRUG SCREEN, HOSP PERFORMED  ETHANOL  AMMONIA    EKG None  Radiology No results found.  Procedures Procedures (including critical care time)  Medications Ordered in ED Medications  ondansetron (ZOFRAN-ODT) disintegrating tablet 4 mg (has no administration in time range)  0.9 %  sodium chloride infusion (has no administration in time range)     Initial Impression / Assessment and Plan / ED Course  I have reviewed the triage vital signs and the nursing  notes.  Pertinent labs & imaging results that were available during my care of the patient were reviewed by me and considered in my medical decision making (see chart for details).        Patient now awake and alert.  Give medication for his headache.  Head CT was negative prior to this.  This is consistent with his prior presentation several months ago.  Denies any SI or HI.  Stable for discharge  Final Clinical Impressions(s) / ED Diagnoses   Final diagnoses:  None    ED Discharge Orders    None       Lorre Nick, MD 07/01/19 1727

## 2019-07-01 NOTE — ED Notes (Signed)
Patient ambulated to the restroom with this RN. Patient reported having bowel movement and urination. Patient verbalizes feeling "a tad" better. Patient endorses continued nausea and headache. Patient drank one cup of Sprite and has held it down. Patient reports he will "try" to eat a [place] sandwich now.

## 2019-07-01 NOTE — ED Notes (Signed)
Patient refused blood draw.

## 2019-07-01 NOTE — ED Notes (Signed)
Patient refused to provide urine sample. When asked if he consents to In and Out catheter for sample collection, patient nodded "yes." This RN, with witness Joe NT, attempted in and out catheter. Patient screamed and became violent, grabbing at staff and yelling "Get it out!" This RN removed catheter and was unable to obtain urine sample at this time.

## 2020-01-12 ENCOUNTER — Ambulatory Visit: Payer: Self-pay

## 2020-01-12 ENCOUNTER — Other Ambulatory Visit: Payer: Self-pay

## 2020-01-12 ENCOUNTER — Encounter: Payer: Self-pay | Admitting: Infectious Diseases

## 2020-01-12 DIAGNOSIS — B20 Human immunodeficiency virus [HIV] disease: Secondary | ICD-10-CM

## 2020-01-12 DIAGNOSIS — Z113 Encounter for screening for infections with a predominantly sexual mode of transmission: Secondary | ICD-10-CM

## 2020-01-12 DIAGNOSIS — Z79899 Other long term (current) drug therapy: Secondary | ICD-10-CM

## 2020-01-12 NOTE — Addendum Note (Signed)
Addended byJimmy Picket F on: 01/12/2020 09:35 AM   Modules accepted: Orders

## 2020-01-12 NOTE — Addendum Note (Signed)
Addended byJimmy Picket F on: 01/12/2020 09:28 AM   Modules accepted: Orders

## 2020-01-13 LAB — URINE CYTOLOGY ANCILLARY ONLY
Chlamydia: NEGATIVE
Comment: NEGATIVE
Comment: NORMAL
Neisseria Gonorrhea: NEGATIVE

## 2020-01-13 LAB — T-HELPER CELL (CD4) - (RCID CLINIC ONLY)
CD4 % Helper T Cell: 25 % — ABNORMAL LOW (ref 33–65)
CD4 T Cell Abs: 460 /uL (ref 400–1790)

## 2020-01-16 LAB — COMPREHENSIVE METABOLIC PANEL
AG Ratio: 1.2 (calc) (ref 1.0–2.5)
ALT: 43 U/L (ref 9–46)
AST: 35 U/L (ref 10–40)
Albumin: 4.4 g/dL (ref 3.6–5.1)
Alkaline phosphatase (APISO): 68 U/L (ref 36–130)
BUN: 9 mg/dL (ref 7–25)
CO2: 29 mmol/L (ref 20–32)
Calcium: 9.4 mg/dL (ref 8.6–10.3)
Chloride: 102 mmol/L (ref 98–110)
Creat: 1.09 mg/dL (ref 0.60–1.35)
Globulin: 3.8 g/dL (calc) — ABNORMAL HIGH (ref 1.9–3.7)
Glucose, Bld: 88 mg/dL (ref 65–99)
Potassium: 3.9 mmol/L (ref 3.5–5.3)
Sodium: 137 mmol/L (ref 135–146)
Total Bilirubin: 0.6 mg/dL (ref 0.2–1.2)
Total Protein: 8.2 g/dL — ABNORMAL HIGH (ref 6.1–8.1)

## 2020-01-16 LAB — CBC WITH DIFFERENTIAL/PLATELET
Absolute Monocytes: 710 cells/uL (ref 200–950)
Basophils Absolute: 32 cells/uL (ref 0–200)
Basophils Relative: 0.6 %
Eosinophils Absolute: 191 cells/uL (ref 15–500)
Eosinophils Relative: 3.6 %
HCT: 47.5 % (ref 38.5–50.0)
Hemoglobin: 16.3 g/dL (ref 13.2–17.1)
Lymphs Abs: 1956 cells/uL (ref 850–3900)
MCH: 30.2 pg (ref 27.0–33.0)
MCHC: 34.3 g/dL (ref 32.0–36.0)
MCV: 88.1 fL (ref 80.0–100.0)
MPV: 9.8 fL (ref 7.5–12.5)
Monocytes Relative: 13.4 %
Neutro Abs: 2412 cells/uL (ref 1500–7800)
Neutrophils Relative %: 45.5 %
Platelets: 253 10*3/uL (ref 140–400)
RBC: 5.39 10*6/uL (ref 4.20–5.80)
RDW: 13 % (ref 11.0–15.0)
Total Lymphocyte: 36.9 %
WBC: 5.3 10*3/uL (ref 3.8–10.8)

## 2020-01-16 LAB — LIPID PANEL
Cholesterol: 166 mg/dL (ref <200)
HDL: 39 mg/dL — ABNORMAL LOW (ref >=40)
LDL Cholesterol (Calc): 109 mg/dL (calc) — ABNORMAL HIGH
Non-HDL Cholesterol (Calc): 127 mg/dL (calc) (ref <130)
Total CHOL/HDL Ratio: 4.3 (calc) (ref <5.0)
Triglycerides: 86 mg/dL (ref <150)

## 2020-01-16 LAB — HIV-1 RNA QUANT-NO REFLEX-BLD
HIV 1 RNA Quant: 1300 copies/mL — ABNORMAL HIGH
HIV-1 RNA Quant, Log: 3.11 Log copies/mL — ABNORMAL HIGH

## 2020-01-16 LAB — RPR: RPR Ser Ql: NONREACTIVE

## 2020-02-07 ENCOUNTER — Ambulatory Visit (INDEPENDENT_AMBULATORY_CARE_PROVIDER_SITE_OTHER): Payer: Self-pay | Admitting: Infectious Diseases

## 2020-02-07 ENCOUNTER — Other Ambulatory Visit: Payer: Self-pay

## 2020-02-07 ENCOUNTER — Ambulatory Visit (INDEPENDENT_AMBULATORY_CARE_PROVIDER_SITE_OTHER): Payer: Self-pay | Admitting: Pharmacist

## 2020-02-07 ENCOUNTER — Encounter: Payer: Self-pay | Admitting: Infectious Diseases

## 2020-02-07 ENCOUNTER — Telehealth: Payer: Self-pay | Admitting: Pharmacy Technician

## 2020-02-07 DIAGNOSIS — B20 Human immunodeficiency virus [HIV] disease: Secondary | ICD-10-CM | POA: Insufficient documentation

## 2020-02-07 DIAGNOSIS — Z21 Asymptomatic human immunodeficiency virus [HIV] infection status: Secondary | ICD-10-CM | POA: Insufficient documentation

## 2020-02-07 DIAGNOSIS — Z8619 Personal history of other infectious and parasitic diseases: Secondary | ICD-10-CM | POA: Insufficient documentation

## 2020-02-07 DIAGNOSIS — B182 Chronic viral hepatitis C: Secondary | ICD-10-CM | POA: Insufficient documentation

## 2020-02-07 DIAGNOSIS — F419 Anxiety disorder, unspecified: Secondary | ICD-10-CM | POA: Insufficient documentation

## 2020-02-07 MED ORDER — BIKTARVY 50-200-25 MG PO TABS: 1.0000 | ORAL_TABLET | Freq: Every day | ORAL | 5 refills | Status: DC

## 2020-02-07 NOTE — Telephone Encounter (Signed)
[facility] Patient Advocate Encounter    Findings of the benefits investigation:   Insurance: unable to find pharmacy benefit coverage for the patient  [facility] Patient Advocate will follow up once patient arrives for their appointment and confirm that there is no coverage. [employer] is currently inactive. ID #[id number] Hazle Quant, CPhT Specialty Pharmacy Patient [facility] Phone: [phone] Fax: [phone] 02/07/2020 8:35 AM

## 2020-02-07 NOTE — Progress Notes (Signed)
HPI: Mark [name] is a 29 y.o. male who presents to the [facility] clinic for HIV follow-up.  Patient Active Problem List   Diagnosis Date Noted  . HIV (human immunodeficiency virus infection) ([place]) 02/07/2020  . Chronic hepatitis C without hepatic coma ([place]) 02/07/2020  . Anxiety 02/07/2020  . Bipolar 1 disorder, depressed ([place]) 12/20/2014  . Systolic murmur 08/67/6195    Patient's Medications  New Prescriptions   No medications on file  Previous Medications   ACETAMINOPHEN (TYLENOL) 325 MG TABLET    Take 650 mg by mouth every 6 (six) hours as needed.   BICTEGRAVIR-EMTRICITABINE-TENOFOVIR AF (BIKTARVY) 50-200-25 MG TABS TABLET    Take 1 tablet by mouth daily.   IBUPROFEN (ADVIL) 200 MG TABLET    Take 200 mg by mouth every 6 (six) hours as needed.  Modified Medications   No medications on file  Discontinued Medications   No medications on file    Allergies: No Known Allergies  Past Medical History: Past Medical History:  Diagnosis Date  . Bipolar 1 disorder ([place])   . Hepatitis C   . HIV (human immunodeficiency virus infection) ([place])     Social History: Social History   Socioeconomic History  . Marital status: Single    Spouse name: Not on file  . Number of children: Not on file  . Years of education: Not on file  . Highest education level: Not on file  Occupational History  . Not on file  Tobacco Use  . Smoking status: Current Every Day Smoker  . Smokeless tobacco: Never Used  Substance and Sexual Activity  . Alcohol use: Yes  . Drug use: Yes    Types: Marijuana  . Sexual activity: Yes  Other Topics Concern  . Not on file  Social History Narrative  . Not on file   Social Determinants of Health   Financial Resource Strain:   . Difficulty of Paying Living Expenses:   Food Insecurity:   . Worried About Charity fundraiser in the Last Year:   . Arboriculturist in the Last Year:   Transportation Needs:   . Film/video editor (Medical):   Marland Kitchen  Lack of Transportation (Non-Medical):   Physical Activity:   . Days of Exercise per Week:   . Minutes of Exercise per Session:   Stress:   . Feeling of Stress :   Social Connections:   . Frequency of Communication with Friends and Family:   . Frequency of Social Gatherings with Friends and Family:   . Attends Religious Services:   . Active Member of Clubs or Organizations:   . Attends Archivist Meetings:   Marland Kitchen Marital Status:     Labs: Lab Results  Component Value Date   HIV1RNAQUANT 1,300 (H) 01/12/2020   HIV1RNAQUANT <20 05/14/2016   CD4TABS 460 01/12/2020   CD4TABS 470 05/14/2016    RPR and STI Lab Results  Component Value Date   LABRPR NON-REACTIVE 01/12/2020   LABRPR NON REAC 05/14/2016   LABRPR NON REACTIVE 09/16/2010    STI Results GC CT  01/12/2020 Negative Negative  05/14/2016 Negative Negative    Hepatitis B Lab Results  Component Value Date   HEPBSAB POS (A) 05/14/2016   HEPBSAG NEGATIVE 05/14/2016   HEPBCAB NON REACTIVE 05/14/2016   Hepatitis C No results found for: HEPCAB, HCVRNAPCRQN Hepatitis A Lab Results  Component Value Date   HAV BORDERLINE (A) 05/14/2016   Lipids: Lab Results  Component Value Date  CHOL 166 01/12/2020   TRIG 86 01/12/2020   HDL 39 (L) 01/12/2020   CHOLHDL 4.3 01/12/2020   VLDL 19 05/14/2016   LDLCALC 109 (H) 01/12/2020    Previous HIV Regimen:  Complera   Stribild   Assessment: Mark [name] is a 29 y.o. male with HIV transferring care from [name], [place] [facility] to [facility]. Patient presents today with his partner to restart HIV therapy. Patient reports that he has been off HIV therapy for approximately 1 year and was previously on Stribild. Most recent CD4 460 and VL 1,300 copies (Apri 2021). Patient expresses that he is ready to achieve undetectable status again. Will start patient on Biktarvy 1 tablet once daily with or without food. Discussed importance of medication  adherence and counseled him on potential side-effects and that if he wishes or needs to take multivitamins in the future, he needs to separate his Biktarvy 2 hours before taking them or 6 hours after. Provided patient with Mark [name]'s phone number in case he has any questions about his medication.  Of note, patient has a history of untreated Hepatitis C, genotype 1a documented first in 2015. Most recent LFTs wnl. Will plan to hold off on starting therapy until patient is stable and compliant on new HIV medication therapy.   Plan: -Start Biktarvy 1 tablet once daily -F/u in 6 weeks with Mark [name] on 6/28  Fabio Neighbors, PharmD PGY1 Ambulatory Care Resident [facility] for Infectious Disease 02/07/2020, 11:35 AM

## 2020-02-07 NOTE — Progress Notes (Signed)
Name: Mark [name]  DOB: May 28, 1991 MRN: [id number] PCP: Patient, No Pcp Per    Patient Active Problem List   Diagnosis Date Noted  . HIV (human immunodeficiency virus infection) ([place]) 02/07/2020  . Chronic hepatitis C without hepatic coma ([place]) 02/07/2020  . Anxiety 02/07/2020  . Bipolar 1 disorder, depressed ([place]) 12/20/2014  . Systolic murmur 84/66/5993     Brief Narrative:  Mark [name] is a 29 y.o. male  is a 29 y.o. with HIV disease, Dx 11-2013.  CD4 nadir > 200 per report of patient  HIV Risk: MSM History of OIs: none Intake Labs 12-2019: Hep B sAg (-), sAb (), cAb (); Hep A (), Hep C (+) Quantiferon (2017 --> negative) HLA B*5701 (-) G6PD: ()   Previous Regimens: . Complera  . Stribild   Genotypes: . Not done   Subjective:   Chief Complaint  Patient presents with  . Follow-up     B20    Pt stated Hep C concern for treatment     HPI: Mark [name] is a 29 y.o. male with HIV transferring care from [place], [place] with [facility]. He was diagnosed around 2015 but it took him a little while to get into care. Eventually started on Complera--> Stribild once daily.  Last labs in 2017 with VL 46 copies / CD4 465. His last medication was Stribild back in 2017. Incarceration and losing ADAP allowed for a lapse in his medications up until now. He states that he continued his Stribild until his last dose prior to being taken to jail. He had no trouble tolerating any of the previous HIV regimens and open to starting on any new pills today. His partner joins him today during the visit (full permission to discuss all health with him present).   He has a history of Hepatitis C, genotype 1a documented first in 2015; Fibrosure revealed F0. ALT 63 ALT 40 Tbili 0.4 at that time. It was discussed to start [place] for him in clinical notes but he was lost to follow up before this occurred. He denies any trouble with skin discoloration/jaundice, icterus,  abdominal pain, nausea, lower extremity edema, rashes or easy bruising/bleeding. He is anxious to get treated.    Review of Systems  Constitutional: Negative for chills, fever, malaise/fatigue and weight loss.  HENT: Negative for sore throat.   Respiratory: Negative for cough, sputum production and shortness of breath.   Cardiovascular: Negative.   Gastrointestinal: Negative for abdominal pain, diarrhea and vomiting.  Musculoskeletal: Negative for joint pain, myalgias and neck pain.  Skin: Negative for rash.  Neurological: Negative for headaches.  Psychiatric/Behavioral: Negative for depression and substance abuse. The patient is not nervous/anxious.     Past Medical History:  Diagnosis Date  . Bipolar 1 disorder ([place])   . Hepatitis C   . HIV (human immunodeficiency virus infection) ([place])     Outpatient Medications Prior to Visit  Medication Sig Dispense Refill  . acetaminophen (TYLENOL) 325 MG tablet Take 650 mg by mouth every 6 (six) hours as needed.    Marland Kitchen ibuprofen (ADVIL) 200 MG tablet Take 200 mg by mouth every 6 (six) hours as needed.    Marland Kitchen azithromycin (ZITHROMAX) 250 MG tablet Take 1 tablet (250 mg total) by mouth daily. Take first 2 tablets together, then 1 every day until finished. (Patient not taking: Reported on 07/01/2019) 6 tablet 0   No facility-administered medications prior to visit.     No Known Allergies  Social History   Tobacco Use  . Smoking status: Current Every Day Smoker  . Smokeless tobacco: Never Used  Substance Use Topics  . Alcohol use: Yes  . Drug use: Yes    Types: Marijuana    Family History  Problem Relation Age of Onset  . Healthy Mother   . Cancer Maternal Grandmother   . Kidney failure Maternal Grandmother   . Diabetes Other     Social History   Substance and Sexual Activity  Sexual Activity Yes     Objective:   Vitals:   02/07/20 0934  BP: 134/78  Pulse: 64  Temp: 98.4 F (36.9 C)  SpO2: 99%  Weight: 230 lb (104.3  kg)  Height: 6' (1.829 m)   Body mass index is 31.19 kg/m.  Physical Exam HENT:     Mouth/Throat:     Mouth: No oral lesions.     Dentition: Normal dentition. No dental caries.  Eyes:     General: No scleral icterus. Cardiovascular:     Rate and Rhythm: Normal rate and regular rhythm.     Heart sounds: Normal heart sounds.  Pulmonary:     Effort: Pulmonary effort is normal.     Breath sounds: Normal breath sounds.  Abdominal:     General: There is no distension.     Palpations: Abdomen is soft.     Tenderness: There is no abdominal tenderness.  Lymphadenopathy:     Cervical: No cervical adenopathy.  Skin:    General: Skin is warm and dry.     Findings: No rash.  Neurological:     Mental Status: He is alert and oriented to person, place, and time.     Lab Results Lab Results  Component Value Date   WBC 5.3 01/12/2020   HGB 16.3 01/12/2020   HCT 47.5 01/12/2020   MCV 88.1 01/12/2020   PLT 253 01/12/2020    Lab Results  Component Value Date   CREATININE 1.09 01/12/2020   BUN 9 01/12/2020   NA 137 01/12/2020   K 3.9 01/12/2020   CL 102 01/12/2020   CO2 29 01/12/2020    Lab Results  Component Value Date   ALT 43 01/12/2020   AST 35 01/12/2020   ALKPHOS 70 07/01/2019   BILITOT 0.6 01/12/2020    Lab Results  Component Value Date   CHOL 166 01/12/2020   HDL 39 (L) 01/12/2020   LDLCALC 109 (H) 01/12/2020   TRIG 86 01/12/2020   CHOLHDL 4.3 01/12/2020   HIV 1 RNA Quant (copies/mL)  Date Value  01/12/2020 1,300 (H)  05/14/2016 <20   CD4 T Cell Abs (/uL)  Date Value  01/12/2020 460  05/14/2016 470     Assessment & Plan:   Problem List Items Addressed This Visit      Unprioritized   HIV (human immunodeficiency virus infection) ([place]) (Chronic)    New patient here to transfer for HIV care. He has been off ARVs for a while and ready to get restarted. VL 1300 and CD4 460. No indications for prophylaxis. Discussed transmission risk with partners with  viral loads > 200 and off medications. Discussed PrEP for partner.   I discussed with Kandace Parkins treatment options/side effects, benefits of treatment and long-term outcomes. I discussed how HIV is transmitted and the process of untreated HIV including increased risk for opportunistic infections, cancer, dementia and renal failure. Patient was counseled on routine HIV care including medication adherence, blood monitoring, necessary vaccines and follow up  visits. Counseled regarding safe sex practices including: condom use, partner disclosure, limiting partners.  Will start [place] for HIV treatment through RW/ADAP program.   General introduction to our clinic and integrated services.  Dental referral placed today for [facility]. Information to schedule appointment completed today.   I spent greater than 45 minutes with the patient today. Greater than 50% of the time spent face-to-face counseling and coordination of care re: HIV and health maintenance.        Relevant Medications   bictegravir-emtricitabine-tenofovir AF (BIKTARVY) 50-200-25 MG TABS tablet   Chronic hepatitis C without hepatic coma (HCC) (Chronic)    Treatment naive, genotype 1a. Fibrosis risk in 2015 with last care encounter F0.  Will get him established back on HIV treatment then re-stage liver for changes with FibroTest, ultrasound if needed. Treat with Harvoni vs Epclusa for single tablet regimen.       Relevant Medications   bictegravir-emtricitabine-tenofovir AF (BIKTARVY) 50-200-25 MG TABS tablet   Anxiety      Janene Madeira, MSN, NP-C [facility] [place] Pager: [phone] Office: [phone]

## 2020-02-07 NOTE — Patient Instructions (Addendum)
Biktarvy is the pill I would like for you to start taking to treat you - this will need to be taken once a day around the same time.  - Common side effects for a short time frame usually include headaches, nausea and diarrhea - OK to take over the counter tylenol for headaches and imodium for diarrhea - Try taking with food if you are nauseated  - If you take any multivitamins or supplements please separate them from your Biktarvy by 6 hours before and after.  The main thing is do not have them in the stomach at the same time.  Allergy medicine - Flonase nasal spray, Allegra, Zyrtec or benadryl   Please come back in 6 weeks to follow up with our pharmacy team and we can start talking about getting your Hepatitis C treated.

## 2020-03-01 ENCOUNTER — Encounter: Payer: Self-pay | Admitting: Infectious Diseases

## 2020-03-04 NOTE — Assessment & Plan Note (Signed)
Treatment naive, genotype 1a. Fibrosis risk in 2015 with last care encounter F0.  Will get him established back on HIV treatment then re-stage liver for changes with FibroTest, ultrasound if needed. Treat with Harvoni vs Epclusa for single tablet regimen.

## 2020-03-04 NOTE — Assessment & Plan Note (Signed)
New patient here to transfer for HIV care. He has been off ARVs for a while and ready to get restarted. VL 1300 and CD4 460. No indications for prophylaxis. Discussed transmission risk with partners with viral loads > 200 and off medications. Discussed PrEP for partner.   I discussed with Siri Cole treatment options/side effects, benefits of treatment and long-term outcomes. I discussed how HIV is transmitted and the process of untreated HIV including increased risk for opportunistic infections, cancer, dementia and renal failure. Patient was counseled on routine HIV care including medication adherence, blood monitoring, necessary vaccines and follow up visits. Counseled regarding safe sex practices including: condom use, partner disclosure, limiting partners.  Will start BIKTARVY for HIV treatment through RW/ADAP program.   General introduction to our clinic and integrated services.  Dental referral placed today for [facility]. Information to schedule appointment completed today.   I spent greater than 45 minutes with the patient today. Greater than 50% of the time spent face-to-face counseling and coordination of care re: HIV and health maintenance.

## 2020-03-27 ENCOUNTER — Other Ambulatory Visit: Payer: Self-pay

## 2020-03-27 ENCOUNTER — Encounter: Payer: Self-pay | Admitting: Infectious Diseases

## 2020-03-27 ENCOUNTER — Ambulatory Visit (INDEPENDENT_AMBULATORY_CARE_PROVIDER_SITE_OTHER): Payer: Self-pay | Admitting: Infectious Diseases

## 2020-03-27 VITALS — BP 125/81 | HR 77 | Temp 98.0°F | Wt 215.4 lb

## 2020-03-27 DIAGNOSIS — B182 Chronic viral hepatitis C: Secondary | ICD-10-CM

## 2020-03-27 DIAGNOSIS — Z21 Asymptomatic human immunodeficiency virus [HIV] infection status: Secondary | ICD-10-CM

## 2020-03-27 DIAGNOSIS — R634 Abnormal weight loss: Secondary | ICD-10-CM | POA: Insufficient documentation

## 2020-03-27 DIAGNOSIS — F419 Anxiety disorder, unspecified: Secondary | ICD-10-CM

## 2020-03-27 NOTE — Progress Notes (Signed)
Name: Mark [name]  DOB: [birth year] MRN: [id number] PCP: Patient, No Pcp Per    Patient Active Problem List   Diagnosis Date Noted  . Weight loss 03/27/2020  . HIV (human immunodeficiency virus infection) ([place]) 02/07/2020  . Chronic hepatitis C without hepatic coma ([place]) 02/07/2020  . Anxiety 02/07/2020  . Bipolar 1 disorder, depressed ([place]) 12/20/2014  . Systolic murmur 87/56/4332     Brief Narrative:  Mark [name] is a 29 y.o. male  is a 29 y.o. with HIV disease, Dx 11-2013.  CD4 nadir > 200 per report of patient  HIV Risk: MSM History of OIs: none Intake Labs 12-2019: Hep B sAg (-), sAb (), cAb (); Hep A (), Hep C (+) Quantiferon (2017 --> negative) HLA B*5701 (-) G6PD: ()   Previous Regimens: . Complera  . Stribild   Genotypes: . Not done   Subjective:   Chief Complaint  Patient presents with  . Follow-up    Is wondering if his results come back well todya, he has concerns about his Hep C side. He wants to get more information on it and is wondering when he can start treatment.     HPI: He has been doing well with the [place] and only missed two doses. He found that taking it in the afternoon is the best for him. He tried taking it at night and it kept him awake.   He has lost about 15 lbs in the last 6 weeks. He does not eat much throughout the day - gets lucky if he gets 2 meals in. He has had increased stress and anxiety and often does not eat anything until 3 pm. He does eat fast food once a day if out and about - eats full meal.   He has a history of Hepatitis C, genotype 1a documented first in 2015; Fibrosure revealed F0. ALT 63 ALT 40 Tbili 0.4 at that time. He is anxious to get started with hepatitis C treatment. He has a hepatitis B immunity through previous vaccination.    Review of Systems  Constitutional: Negative for chills, fever, malaise/fatigue and weight loss.  HENT: Negative for sore throat.   Respiratory: Negative for cough, sputum  production and shortness of breath.   Cardiovascular: Negative.   Gastrointestinal: Negative for abdominal pain, diarrhea and vomiting.  Musculoskeletal: Negative for joint pain, myalgias and neck pain.  Skin: Negative for rash.  Neurological: Negative for headaches.  Psychiatric/Behavioral: Negative for depression and substance abuse. The patient is not nervous/anxious.     Past Medical History:  Diagnosis Date  . Bipolar 1 disorder ([place])   . Hepatitis C   . HIV (human immunodeficiency virus infection) ([place])     Outpatient Medications Prior to Visit  Medication Sig Dispense Refill  . acetaminophen (TYLENOL) 325 MG tablet Take 650 mg by mouth every 6 (six) hours as needed.    . bictegravir-emtricitabine-tenofovir AF (BIKTARVY) 50-200-25 MG TABS tablet Take 1 tablet by mouth daily. 30 tablet 5  . ibuprofen (ADVIL) 200 MG tablet Take 200 mg by mouth every 6 (six) hours as needed.     No facility-administered medications prior to visit.     No Known Allergies  Social History   Tobacco Use  . Smoking status: Current Every Day Smoker    Packs/day: 0.40  . Smokeless tobacco: Never Used  Substance Use Topics  . Alcohol use: Yes    Alcohol/week: 2.0 standard drinks    Types: 2 Glasses of wine per week  Comment: occassionally  . Drug use: Yes    Frequency: 7.0 times per week    Types: Marijuana    Family History  Problem Relation Age of Onset  . Healthy Mother   . Cancer Maternal Grandmother   . Kidney failure Maternal Grandmother   . Diabetes Other     Social History   Substance and Sexual Activity  Sexual Activity Yes     Objective:   Vitals:   03/27/20 1101  BP: 125/81  Pulse: 77  Temp: 98 F (36.7 C)  SpO2: 97%  Weight: 215 lb 6.4 oz (97.7 kg)   Body mass index is 29.21 kg/m.   Physical Exam HENT:     Mouth/Throat:     Mouth: No oral lesions.     Dentition: Normal dentition. No dental caries.  Eyes:     General: No scleral  icterus. Cardiovascular:     Rate and Rhythm: Normal rate and regular rhythm.     Heart sounds: Normal heart sounds.  Pulmonary:     Effort: Pulmonary effort is normal.     Breath sounds: Normal breath sounds.  Abdominal:     General: There is no distension.     Palpations: Abdomen is soft.     Tenderness: There is no abdominal tenderness.  Lymphadenopathy:     Cervical: No cervical adenopathy.  Skin:    General: Skin is warm and dry.     Findings: No rash.  Neurological:     Mental Status: He is alert and oriented to person, place, and time.     Lab Results Lab Results  Component Value Date   WBC 5.3 01/12/2020   HGB 16.3 01/12/2020   HCT 47.5 01/12/2020   MCV 88.1 01/12/2020   PLT 253 01/12/2020    Lab Results  Component Value Date   CREATININE 1.09 01/12/2020   BUN 9 01/12/2020   NA 137 01/12/2020   K 3.9 01/12/2020   CL 102 01/12/2020   CO2 29 01/12/2020    Lab Results  Component Value Date   ALT 43 01/12/2020   AST 35 01/12/2020   ALKPHOS 70 07/01/2019   BILITOT 0.6 01/12/2020    Lab Results  Component Value Date   CHOL 166 01/12/2020   HDL 39 (L) 01/12/2020   LDLCALC 109 (H) 01/12/2020   TRIG 86 01/12/2020   CHOLHDL 4.3 01/12/2020   HIV 1 RNA Quant (copies/mL)  Date Value  01/12/2020 1,300 (H)  05/14/2016 <20   CD4 T Cell Abs (/uL)  Date Value  01/12/2020 460  05/14/2016 470     Assessment & Plan:   Problem List Items Addressed This Visit      Unprioritized   HIV (human immunodeficiency virus infection) ([place]) - Primary (Chronic)    He seems to be doing well on Biktarvy and taking it correctly. Will update viral load today, he should be undetectable at this point.  Return in about 3 months (around 06/27/2020).       Relevant Orders   HIV-1 RNA quant-no reflex-bld   Chronic hepatitis C without hepatic coma ([place]) (Chronic)    Will update labs today and plan to get him started soon for treatment. Will restage liver with Fibrotest today -  ultrasound if indicated. FIB4 0.59 indicating very statistically low risk for any severe liver disease.  Previously 1a genotype documented.       Relevant Orders   Liver Fibrosis, FibroTest-ActiTest   Hepatitis C RNA quantitative   Hepatitis C  RNA quantitative ([facility])   Hepatitis C genotype   Anxiety    Recommended he schedule an appointment with Marcie Bal for counseling services.       Weight loss    He has had unintentional weight loss of about 15 lbs now. Based on discussion most likely related to increased stress / anxiety and decreased frequency of eating. Referral to counseling services to deal with anxiety.  He is using marijuana for help with appetite - will defer adding any prescriptions for now.  He will come back in 6 weeks for a weight check.          Janene Madeira, MSN, NP-C [facility] for Infectious [place] Pager: [phone] Office: [phone]

## 2020-03-27 NOTE — Assessment & Plan Note (Signed)
Will update labs today and plan to get him started soon for treatment. Will restage liver with Fibrotest today - ultrasound if indicated. FIB4 0.59 indicating very statistically low risk for any severe liver disease.  Previously 1a genotype documented.

## 2020-03-27 NOTE — Assessment & Plan Note (Signed)
He has had unintentional weight loss of about 15 lbs now. Based on discussion most likely related to increased stress / anxiety and decreased frequency of eating. Referral to counseling services to deal with anxiety.  He is using marijuana for help with appetite - will defer adding any prescriptions for now.  He will come back in 6 weeks for a weight check.

## 2020-03-27 NOTE — Assessment & Plan Note (Signed)
He seems to be doing well on Biktarvy and taking it correctly. Will update viral load today, he should be undetectable at this point.  Return in about 3 months (around 06/27/2020).

## 2020-03-27 NOTE — Assessment & Plan Note (Signed)
Recommended he schedule an appointment with Marylu Lund for counseling services.

## 2020-03-27 NOTE — Patient Instructions (Addendum)
Please continue taking your Biktarvy every day.   Will also start working on hepatitis C treatment for you.   Please schedule an appointment with our counselor Marylu Lund at your ability.   Will have you back for a nurse visit to re-check your weight in 6 weeks.   Please schedule an appointment with Judeth Cornfield in 3 months.

## 2020-04-04 LAB — LIVER FIBROSIS, FIBROTEST-ACTITEST
ALT: 73 U/L — ABNORMAL HIGH (ref 9–46)
Alpha-2-Macroglobulin: 166 mg/dL (ref 106–279)
Apolipoprotein A1: 164 mg/dL (ref 94–176)
Bilirubin: 0.7 mg/dL (ref 0.2–1.2)
Fibrosis Score: 0.18
GGT: 114 U/L — ABNORMAL HIGH (ref 3–70)
Haptoglobin: 120 mg/dL (ref 43–212)
Necroinflammat ACT Score: 0.39
Reference ID: 3458661

## 2020-04-04 LAB — HEPATITIS C GENOTYPE

## 2020-04-04 LAB — HIV-1 RNA QUANT-NO REFLEX-BLD
HIV 1 RNA Quant: <20 copies/mL
HIV-1 RNA Quant, Log: <1.3 Log copies/mL

## 2020-04-04 LAB — HEPATITIS C RNA QUANTITATIVE
HCV Quantitative Log: 5.92 Log IU/mL — ABNORMAL HIGH
HCV RNA, PCR, QN: 839000 IU/mL — ABNORMAL HIGH

## 2020-04-05 ENCOUNTER — Telehealth: Payer: Self-pay | Admitting: Pharmacy Technician

## 2020-04-05 ENCOUNTER — Other Ambulatory Visit: Payer: Self-pay | Admitting: Pharmacist

## 2020-04-05 DIAGNOSIS — B182 Chronic viral hepatitis C: Secondary | ICD-10-CM

## 2020-04-05 MED ORDER — LEDIPASVIR-SOFOSBUVIR 90-400 MG PO TABS: 1.0000 | ORAL_TABLET | Freq: Every day | ORAL | 2 refills | Status: DC

## 2020-04-05 NOTE — Progress Notes (Signed)
Sending in Harvoni x 12 weeks for patient's chronic Hepatitis C infection.

## 2020-04-05 NOTE — Telephone Encounter (Signed)
[facility] Patient Advocate Encounter  I want to complete and sent Support Path application for Harvoni for this patient who is uninsured.    I left a HIPPA compliant voicemail asking him to call me.  Before I can submit, I will need his income.  This encounter will be updated until final determination.   Netty Starring. [name] CPhT Specialty Pharmacy Patient [facility] for Infectious Disease Phone: [phone] Fax:  [phone]

## 2020-04-05 NOTE — Progress Notes (Signed)
Yes, 12 weeks! Thanks!

## 2020-04-11 ENCOUNTER — Ambulatory Visit: Payer: Self-pay

## 2020-04-11 NOTE — Telephone Encounter (Signed)
Tried reaching the patient again today.  Mark [name]. [name] CPhT Specialty Pharmacy Patient [facility] for Infectious Disease Phone: [phone] Fax:  [phone]

## 2020-05-08 ENCOUNTER — Other Ambulatory Visit: Payer: Self-pay

## 2020-05-08 ENCOUNTER — Ambulatory Visit: Payer: Self-pay

## 2020-05-08 VITALS — Wt 216.4 lb

## 2020-05-08 DIAGNOSIS — B20 Human immunodeficiency virus [HIV] disease: Secondary | ICD-10-CM

## 2020-05-08 NOTE — Progress Notes (Signed)
Patient reportedly eating fast food when he's "out" and home cooked meals at home; reports that he is trying to eat more.   Steffie Waggoner Loyola Mast, RN

## 2020-05-16 NOTE — Telephone Encounter (Signed)
[facility] Patient Advocate Encounter  Tried to reach patient again.  A male answered and took a message asking Mr. Morejon to call me back.  No other information was given to her.  This is the fourth attempt to reach the patient.  We need income information to help get him approved for Harvoni.   Netty Starring. [name] CPhT Specialty Pharmacy Patient [facility] for Infectious Disease Phone: [phone] Fax:  [phone]

## 2020-05-18 ENCOUNTER — Ambulatory Visit (HOSPITAL_COMMUNITY)
Admission: EM | Admit: 2020-05-18 | Discharge: 2020-05-18 | Disposition: A | Discharge status: Home / Self Care | Payer: HRSA Program | Attending: Family Medicine | Admitting: Family Medicine

## 2020-05-18 ENCOUNTER — Other Ambulatory Visit: Payer: Self-pay

## 2020-05-18 ENCOUNTER — Encounter (HOSPITAL_COMMUNITY): Payer: Self-pay

## 2020-05-18 DIAGNOSIS — Z79899 Other long term (current) drug therapy: Secondary | ICD-10-CM | POA: Insufficient documentation

## 2020-05-18 DIAGNOSIS — R1111 Vomiting without nausea: Secondary | ICD-10-CM | POA: Insufficient documentation

## 2020-05-18 DIAGNOSIS — F319 Bipolar disorder, unspecified: Secondary | ICD-10-CM | POA: Diagnosis not present

## 2020-05-18 DIAGNOSIS — Z791 Long term (current) use of non-steroidal anti-inflammatories (NSAID): Secondary | ICD-10-CM | POA: Insufficient documentation

## 2020-05-18 DIAGNOSIS — F17210 Nicotine dependence, cigarettes, uncomplicated: Secondary | ICD-10-CM | POA: Insufficient documentation

## 2020-05-18 DIAGNOSIS — F419 Anxiety disorder, unspecified: Secondary | ICD-10-CM | POA: Diagnosis not present

## 2020-05-18 DIAGNOSIS — Z21 Asymptomatic human immunodeficiency virus [HIV] infection status: Secondary | ICD-10-CM | POA: Diagnosis not present

## 2020-05-18 DIAGNOSIS — Z20822 Contact with and (suspected) exposure to covid-19: Secondary | ICD-10-CM | POA: Insufficient documentation

## 2020-05-18 DIAGNOSIS — B182 Chronic viral hepatitis C: Secondary | ICD-10-CM | POA: Insufficient documentation

## 2020-05-18 NOTE — ED Triage Notes (Signed)
Pt c/o vomiting x 1 episode after eating chicken sandwich at lunch, pt concerned bc he is HIV+

## 2020-05-18 NOTE — Discharge Instructions (Addendum)
Will call you with covid results. No red flags at this time. Avoid high fat foods over next 24 hours.

## 2020-05-18 NOTE — ED Provider Notes (Signed)
[facility]    CSN: [id number] Arrival date & time: 05/18/20  1451      History   Chief Complaint Chief Complaint  Patient presents with  . Emesis    HPI Izaia Say is a 29 y.o. male.   Who carries a past medical history significant for HIV. He presents per recommendation from his employer due to emesis x 1 today following eating lunch. He felt "fine" prior to this and feel ok now. They wanted him to take a covid test. He has no abdominal pain. No further nauesea. No fevers. No respiratory symptoms at this time and no known exposures.      Past Medical History:  Diagnosis Date  . Bipolar 1 disorder (HCC)   . Hepatitis C   . HIV (human immunodeficiency virus infection) [facility])     Patient Active Problem List   Diagnosis Date Noted  . Weight loss 03/27/2020  . HIV (human immunodeficiency virus infection) (HCC) 02/07/2020  . Chronic hepatitis C without hepatic coma (HCC) 02/07/2020  . Anxiety 02/07/2020  . Bipolar 1 disorder, depressed (HCC) 12/20/2014  . Systolic murmur 12/20/2014    History reviewed. No pertinent surgical history.     Home Medications    Prior to Admission medications   Medication Sig Start Date End Date Taking? Authorizing Provider  acetaminophen (TYLENOL) 325 MG tablet Take 650 mg by mouth every 6 (six) hours as needed.    [provider]  bictegravir-emtricitabine-tenofovir AF (BIKTARVY) 50-200-25 MG TABS tablet Take 1 tablet by mouth daily. 02/07/20   Blanchard Kelch, NP  ibuprofen (ADVIL) 200 MG tablet Take 200 mg by mouth every 6 (six) hours as needed.    [provider]  Ledipasvir-Sofosbuvir (HARVONI) 90-400 MG TABS Take 1 tablet by mouth daily. 04/05/20   Blanchard Kelch, NP    Family History Family History  Problem Relation Age of Onset  . Healthy Mother   . Cancer Maternal Grandmother   . Kidney failure Maternal Grandmother   . Diabetes Other     Social History Social History   Tobacco Use    . Smoking status: Current Every Day Smoker    Packs/day: 0.40  . Smokeless tobacco: Never Used  Substance Use Topics  . Alcohol use: Yes    Alcohol/week: 2.0 standard drinks    Types: 2 Glasses of wine per week    Comment: occassionally  . Drug use: Yes    Frequency: 7.0 times per week    Types: Marijuana     Allergies   Patient has no known allergies.   Review of Systems Review of Systems  All other systems reviewed and are negative.    Physical Exam Triage Vital Signs ED Triage Vitals  Enc Vitals Group     BP 05/18/20 1643 123/78     Pulse Rate 05/18/20 1643 (!) 58     Resp 05/18/20 1643 18     Temp 05/18/20 1643 98.4 F (36.9 C)     Temp src --      SpO2 05/18/20 1643 98 %     Weight --      Height --      Head Circumference --      Peak Flow --      Pain Score 05/18/20 1644 0     Pain Loc --      Pain Edu? --      Excl. in GC? --    No data found.  Updated Vital  Signs BP 123/78   Pulse (!) 58   Temp 98.4 F (36.9 C)   Resp 18   SpO2 98%   Visual Acuity Right Eye Distance:   Left Eye Distance:   Bilateral Distance:    Right Eye Near:   Left Eye Near:    Bilateral Near:     Physical Exam Vitals and nursing note reviewed.  Constitutional:      General: He is not in acute distress.    Appearance: Normal appearance. He is not ill-appearing.  Cardiovascular:     Rate and Rhythm: Normal rate and regular rhythm.     Pulses: Normal pulses.  Pulmonary:     Effort: Pulmonary effort is normal.     Breath sounds: Normal breath sounds.  Abdominal:     General: Abdomen is flat. Bowel sounds are normal. There is no distension.     Palpations: There is no mass.     Tenderness: There is no abdominal tenderness. There is no guarding.  Skin:    General: Skin is warm and dry.  Neurological:     General: No focal deficit present.     Mental Status: He is alert.  Psychiatric:        Mood and Affect: Mood normal.      UC Treatments / Results   Labs (all labs ordered are listed, but only abnormal results are displayed) Labs Reviewed  SARS CORONAVIRUS 2 (TAT 6-24 HRS)    EKG   Radiology No results found.  Procedures Procedures (including critical care time)  Medications Ordered in UC Medications - No data to display  Initial Impression / Assessment and Plan / UC Course  I have reviewed the triage vital signs and the nursing notes.  Pertinent labs & imaging results that were available during my care of the patient were reviewed by me and considered in my medical decision making (see chart for details).    Patient with nausea x 1 without further ongoing symptoms. Ok for covid testing. Will give a work note. He has anti-emetics at home if needed. FU prn Final Clinical Impressions(s) / UC Diagnoses   Final diagnoses:  None   Discharge Instructions   None    ED Prescriptions    None     PDMP not reviewed this encounter.   Riki Sheer, [place] 05/18/20 1713

## 2020-05-19 LAB — SARS CORONAVIRUS 2 (TAT 6-24 HRS): SARS Coronavirus 2: NEGATIVE

## 2020-06-27 ENCOUNTER — Telehealth: Payer: Self-pay

## 2020-06-27 ENCOUNTER — Encounter: Payer: Self-pay | Admitting: Infectious Diseases

## 2020-06-27 ENCOUNTER — Ambulatory Visit: Payer: Self-pay

## 2020-06-27 ENCOUNTER — Ambulatory Visit (INDEPENDENT_AMBULATORY_CARE_PROVIDER_SITE_OTHER): Payer: Self-pay | Admitting: Infectious Diseases

## 2020-06-27 ENCOUNTER — Other Ambulatory Visit: Payer: Self-pay

## 2020-06-27 DIAGNOSIS — B182 Chronic viral hepatitis C: Secondary | ICD-10-CM

## 2020-06-27 DIAGNOSIS — Z21 Asymptomatic human immunodeficiency virus [HIV] infection status: Secondary | ICD-10-CM

## 2020-06-27 DIAGNOSIS — Z7189 Other specified counseling: Secondary | ICD-10-CM

## 2020-06-27 DIAGNOSIS — Z7185 Encounter for immunization safety counseling: Secondary | ICD-10-CM | POA: Insufficient documentation

## 2020-06-27 DIAGNOSIS — R634 Abnormal weight loss: Secondary | ICD-10-CM

## 2020-06-27 NOTE — Patient Instructions (Signed)
Nice to see you today!  Please CONTINUE your Biktarvy once a day as you have been doing  Please START your HARVONI once a day when you get it shipped to you. This will need to continue every day for 12 weeks total.    Please schedule your Laural Benes and Laural Benes COVID Vaccine for Friday of this week here in the clinic.    Follow up with Cassie in 5-6 weeks  - we will plan to draw your blood at this visit.

## 2020-06-27 NOTE — Assessment & Plan Note (Signed)
He is doing well on his Biktarvy.  Last viral load in May was undetectable.  He is taking this correctly and no concerns for drug interactions.  Will defer follow-up blood work today since we need to do blood work in 1 month after starting Harvoni would like to check viral load and CD4 count as well as RPR for ongoing monitoring

## 2020-06-27 NOTE — Assessment & Plan Note (Signed)
We had a good discussion about available options for Covid vaccine today.  His largest fear includes potential side effects associated with the mRNA vaccine that would make him sick and unable to work for 48 hours.  He is amenable to receiving the [employer] vaccine.  I feel completely comfortable with him getting the shot.  We will see if he can come back on Friday to receive this in our Covid vaccine clinic.

## 2020-06-27 NOTE — Assessment & Plan Note (Signed)
Genotype 1a, Medicare F0.  Awaiting to start Harvoni x12 weeks.  We have been trying to get in touch with him to arrange shipment.  We will ask with pharmacy representatives to coordinate with him today.  Would like him to come back 46 weeks after to meet with Cassie and repeat blood work at that time including routine HIV labs.

## 2020-06-27 NOTE — Telephone Encounter (Addendum)
[facility] Patient Advocate Encounter  Completed and sent Support Path application for Harvoni for this patient who is uninsured.  Pt is approved from 06/29/20 to 09/21/20     Patient assistance phone number for follow up is [phone].   This encounter will be updated until final determination.   Clearance Coots, CPhT Specialty Pharmacy Patient [facility] for Infectious Disease Phone: [phone] Fax:  [phone]

## 2020-06-27 NOTE — Progress Notes (Signed)
Name: Mark [name]  DOB: November 07, 1990 MRN: [id number] PCP: Patient, No Pcp Per    Patient Active Problem List   Diagnosis Date Noted  . Vaccine counseling 06/27/2020  . HIV (human immunodeficiency virus infection) ([place]) 02/07/2020  . Chronic hepatitis C without hepatic coma ([place]) 02/07/2020  . Anxiety 02/07/2020  . Bipolar 1 disorder, depressed ([place]) 12/20/2014  . Systolic murmur 78/29/5621     Brief Narrative:  Mark [name] is a 29 y.o. male  is a 29 y.o. with HIV disease, Dx 11-2013.  CD4 nadir > 200 per report of patient  HIV Risk: MSM History of OIs: none Intake Labs 12-2019: Hep B sAg (-), sAb (), cAb (); Hep A (), Hep C (+) Quantiferon (2017 --> negative) HLA B*5701 (-) G6PD: ()   Previous Regimens: . Complera  . Stribild  . Biktarvy    Genotypes: . Not done   Subjective:   Chief Complaint  Patient presents with  . Follow-up    given condoms      HPI: Mark [name] has been doing well since his last office visit.  He continues to take his Biktarvy every morning with no missed doses.  He has no concern for side effects or missed medications.  He would like to make sure he is not lost any more weight today.  Has noticed that his clothes fit differently that also moving around more work.  He has questions about the Covid vaccine and whether it safe to take in the propria timing for him.  He has not yet received his Harvoni.   Review of Systems  Constitutional: Negative for chills, fever, malaise/fatigue and weight loss.  HENT: Negative for sore throat.   Respiratory: Negative for cough, sputum production and shortness of breath.   Cardiovascular: Negative.   Gastrointestinal: Negative for abdominal pain, diarrhea and vomiting.  Musculoskeletal: Negative for joint pain, myalgias and neck pain.  Skin: Negative for rash.  Neurological: Negative for headaches.  Psychiatric/Behavioral: Negative for depression and substance abuse. The patient is not nervous/anxious.       Past Medical History:  Diagnosis Date  . Bipolar 1 disorder ([place])   . Hepatitis C   . HIV (human immunodeficiency virus infection) ([place])     Outpatient Medications Prior to Visit  Medication Sig Dispense Refill  . acetaminophen (TYLENOL) 325 MG tablet Take 650 mg by mouth every 6 (six) hours as needed.    . bictegravir-emtricitabine-tenofovir AF (BIKTARVY) 50-200-25 MG TABS tablet Take 1 tablet by mouth daily. 30 tablet 5  . ibuprofen (ADVIL) 200 MG tablet Take 200 mg by mouth every 6 (six) hours as needed.    . Ledipasvir-Sofosbuvir (HARVONI) 90-400 MG TABS Take 1 tablet by mouth daily. (Patient not taking: Reported on 06/27/2020) 28 tablet 2   No facility-administered medications prior to visit.     No Known Allergies  Social History   Tobacco Use  . Smoking status: Current Every Day Smoker    Packs/day: 0.40  . Smokeless tobacco: Never Used  Substance Use Topics  . Alcohol use: Yes    Alcohol/week: 2.0 standard drinks    Types: 2 Glasses of wine per week    Comment: occassionally  . Drug use: Yes    Frequency: 7.0 times per week    Types: Marijuana    Family History  Problem Relation Age of Onset  . Healthy Mother   . Cancer Maternal Grandmother   . Kidney failure Maternal Grandmother   . Diabetes Other  Social History   Substance and Sexual Activity  Sexual Activity Yes  . Partners: Male     Objective:   Vitals:   06/27/20 1055  BP: (!) 162/84  Pulse: 94  Temp: 98.6 F (37 C)  TempSrc: Oral  SpO2: 98%  Weight: 215 lb (97.5 kg)  Height: 6' (1.829 m)   Body mass index is 29.16 kg/m.   Physical Exam HENT:     Mouth/Throat:     Mouth: No oral lesions.     Dentition: Normal dentition. No dental caries.  Eyes:     General: No scleral icterus. Cardiovascular:     Rate and Rhythm: Normal rate and regular rhythm.     Heart sounds: Normal heart sounds.  Pulmonary:     Effort: Pulmonary effort is normal.     Breath sounds: Normal  breath sounds.  Abdominal:     General: There is no distension.     Palpations: Abdomen is soft.     Tenderness: There is no abdominal tenderness.  Lymphadenopathy:     Cervical: No cervical adenopathy.  Skin:    General: Skin is warm and dry.     Findings: No rash.  Neurological:     Mental Status: He is alert and oriented to person, place, and time.     Lab Results Lab Results  Component Value Date   WBC 5.3 01/12/2020   HGB 16.3 01/12/2020   HCT 47.5 01/12/2020   MCV 88.1 01/12/2020   PLT 253 01/12/2020    Lab Results  Component Value Date   CREATININE 1.09 01/12/2020   BUN 9 01/12/2020   NA 137 01/12/2020   K 3.9 01/12/2020   CL 102 01/12/2020   CO2 29 01/12/2020    Lab Results  Component Value Date   ALT 73 (H) 03/27/2020   AST 35 01/12/2020   GGT 114 (H) 03/27/2020   ALKPHOS 70 07/01/2019   BILITOT 0.6 01/12/2020    Lab Results  Component Value Date   CHOL 166 01/12/2020   HDL 39 (L) 01/12/2020   LDLCALC 109 (H) 01/12/2020   TRIG 86 01/12/2020   CHOLHDL 4.3 01/12/2020   HIV 1 RNA Quant (copies/mL)  Date Value  03/27/2020 <20 NOT DETECTED  01/12/2020 1,300 (H)  05/14/2016 <20   CD4 T Cell Abs (/uL)  Date Value  01/12/2020 460  05/14/2016 470     Assessment & Plan:   Problem List Items Addressed This Visit      Unprioritized   RESOLVED: Weight loss    No change in his weight on the scale.  Stable over the last 3 months.      Vaccine counseling    We had a good discussion about available options for Covid vaccine today.  His largest fear includes potential side effects associated with the mRNA vaccine that would make him sick and unable to work for 48 hours.  He is amenable to receiving the [employer] vaccine.  I feel completely comfortable with him getting the shot.  We will see if he can come back on Friday to receive this in our Covid vaccine clinic.      HIV (human immunodeficiency virus infection) ([place]) (Chronic)    He is  doing well on his Biktarvy.  Last viral load in May was undetectable.  He is taking this correctly and no concerns for drug interactions.  Will defer follow-up blood work today since we need to do blood work in 1 month after  starting Harvoni would like to check viral load and CD4 count as well as RPR for ongoing monitoring      Chronic hepatitis C without hepatic coma (HCC) (Chronic)    Genotype 1a, Medicare F0.  Awaiting to start Harvoni x12 weeks.  We have been trying to get in touch with him to arrange shipment.  We will ask with pharmacy representatives to coordinate with him today.  Would like him to come back 46 weeks after to meet with Cassie and repeat blood work at that time including routine HIV labs.         Janene Madeira, MSN, NP-C [facility] for Infectious [place] Pager: [phone] Office: [phone]

## 2020-06-27 NOTE — Assessment & Plan Note (Signed)
No change in his weight on the scale.  Stable over the last 3 months.

## 2020-07-04 ENCOUNTER — Telehealth: Payer: Self-pay

## 2020-07-05 ENCOUNTER — Encounter: Payer: Self-pay | Admitting: Pharmacy Technician

## 2020-07-05 ENCOUNTER — Telehealth: Payer: Self-pay | Admitting: Pharmacist

## 2020-07-05 NOTE — Telephone Encounter (Signed)
Patient is approved to receive Harvoni x 12 weeks for chronic Hepatitis C infection. Counseled patient to take Harvoni daily with or without food. Encouraged patient not to miss any doses and explained how their chance of cure could go down with each dose missed.  Counseled patient on what to do if dose is missed - if it is closer to the missed dose take immediately; if closer to next dose then skip dose and take the next dose at the usual time.   Counseled patient on common side effects such as headache, fatigue, and nausea and that these normally decrease with time. I reviewed patient medications and found no interactions. Discussed with patient that there are several drug interactions including acid suppressants. Instructed patient to call clinic if he wishes to start a new medication during course of therapy. Also advised patient to call if he experiences side effects. Patient will follow-up with me in the pharmacy clinic on 11/9.

## 2020-07-06 ENCOUNTER — Encounter: Payer: Self-pay | Admitting: Infectious Diseases

## 2020-07-11 NOTE — Telephone Encounter (Signed)
Type error

## 2020-07-14 ENCOUNTER — Ambulatory Visit (INDEPENDENT_AMBULATORY_CARE_PROVIDER_SITE_OTHER): Payer: Self-pay

## 2020-07-14 ENCOUNTER — Other Ambulatory Visit: Payer: Self-pay

## 2020-07-14 DIAGNOSIS — Z23 Encounter for immunization: Secondary | ICD-10-CM

## 2020-07-14 NOTE — Progress Notes (Signed)
   Covid-19 Vaccination Clinic  Name:  Mark [name]    MRN: [id number] DOB: [birth year]  07/14/2020  [name] was observed post Covid-19 immunization for 15 minutes without incident. He was provided with Vaccine Information Sheet and instruction to access the V-Safe system.   [name] was instructed to call 911 with any severe reactions post vaccine: Marland Kitchen Difficulty breathing  . Swelling of face and throat  . A fast heartbeat  . A bad rash all over body  . Dizziness and weakness   Immunizations Administered    Name Date Dose VIS Date Route   Pfizer COVID-19 Vaccine 07/14/2020  9:45 AM 0.3 mL 11/24/2018 Intramuscular   Manufacturer: [employer], [employer]   Lot: [id number]   NDC: [address]-3     Andree Coss, RN

## 2020-07-31 ENCOUNTER — Ambulatory Visit: Payer: Self-pay | Admitting: Pharmacist

## 2020-08-04 ENCOUNTER — Ambulatory Visit: Payer: Self-pay

## 2020-08-08 ENCOUNTER — Telehealth: Payer: Self-pay | Admitting: *Deleted

## 2020-08-08 ENCOUNTER — Ambulatory Visit (INDEPENDENT_AMBULATORY_CARE_PROVIDER_SITE_OTHER): Payer: Self-pay | Admitting: Pharmacist

## 2020-08-08 ENCOUNTER — Other Ambulatory Visit: Payer: Self-pay

## 2020-08-08 DIAGNOSIS — B20 Human immunodeficiency virus [HIV] disease: Secondary | ICD-10-CM

## 2020-08-08 DIAGNOSIS — Z21 Asymptomatic human immunodeficiency virus [HIV] infection status: Secondary | ICD-10-CM

## 2020-08-08 NOTE — Progress Notes (Signed)
HPI: Mark [name] is a 29 y.o. male who presents to [name] for Hepatitis C follow-up.  Medication: Harvoni x 12 weeks   Start Date: 07/07/20  Hepatitis C Genotype: 1a  Fibrosis Score: F0  Hepatitis C RNA: 839,000  Patient Active Problem List   Diagnosis Date Noted   Vaccine counseling 06/27/2020   HIV (human immunodeficiency virus infection) (HCC) 02/07/2020   Chronic hepatitis C without hepatic coma (HCC) 02/07/2020   Anxiety 02/07/2020   Bipolar 1 disorder, depressed (HCC) 12/20/2014   Systolic murmur 12/20/2014    Patient's Medications  New Prescriptions   No medications on file  Previous Medications   ACETAMINOPHEN (TYLENOL) 325 MG TABLET    Take 650 mg by mouth every 6 (six) hours as needed.   BICTEGRAVIR-EMTRICITABINE-TENOFOVIR AF (BIKTARVY) 50-200-25 MG TABS TABLET    Take 1 tablet by mouth daily.   IBUPROFEN (ADVIL) 200 MG TABLET    Take 200 mg by mouth every 6 (six) hours as needed.   LEDIPASVIR-SOFOSBUVIR (HARVONI) 90-400 MG TABS    Take 1 tablet by mouth daily.  Modified Medications   No medications on file  Discontinued Medications   No medications on file    Allergies: No Known Allergies  Past Medical History: Past Medical History:  Diagnosis Date   Bipolar 1 disorder (HCC)    Hepatitis C    HIV (human immunodeficiency virus infection) (HCC)     Social History: Social History   Socioeconomic History   Marital status: Single    Spouse name: Not on file   Number of children: Not on file   Years of education: Not on file   Highest education level: Not on file  Occupational History   Not on file  Tobacco Use   Smoking status: Current Every Day Smoker    Packs/day: 0.40   Smokeless tobacco: Never Used  Substance and Sexual Activity   Alcohol use: Yes    Alcohol/week: 2.0 standard drinks    Types: 2 Glasses of wine per week    Comment: occassionally   Drug use: Yes    Frequency: 7.0 times per week     Types: Marijuana   Sexual activity: Yes    Partners: Male  Other Topics Concern   Not on file  Social History Narrative   Not on file   Social Determinants of Health   Financial Resource Strain:    Difficulty of Paying Living Expenses: Not on file  Food Insecurity:    Worried About Programme researcher, broadcasting/film/video in the Last Year: Not on file   [employer] of Food in the Last Year: Not on file  Transportation Needs:    Lack of Transportation (Medical): Not on file   Lack of Transportation (Non-Medical): Not on file  Physical Activity:    Days of Exercise per Week: Not on file   Minutes of Exercise per Session: Not on file  Stress:    Feeling of Stress : Not on file  Social Connections:    Frequency of Communication with Friends and Family: Not on file   Frequency of Social Gatherings with Friends and Family: Not on file   Attends Religious Services: Not on file   Active Member of Clubs or Organizations: Not on file   Attends Banker Meetings: Not on file   Marital Status: Not on file    Labs: Hepatitis C Lab Results  Component Value Date   HCVGENOTYPE 1a 03/27/2020   HCVRNAPCRQN 839,000 (  H) 03/27/2020   FIBROSTAGE F0 03/27/2020   Hepatitis B Lab Results  Component Value Date   HEPBSAB POS (A) 05/14/2016   HEPBSAG NEGATIVE 05/14/2016   HEPBCAB NON REACTIVE 05/14/2016   Hepatitis A Lab Results  Component Value Date   HAV BORDERLINE (A) 05/14/2016   HIV Lab Results  Component Value Date   HIV NON REACTIVE 09/16/2010   Lab Results  Component Value Date   CREATININE 1.09 01/12/2020   CREATININE 1.11 07/01/2019   CREATININE 1.10 03/02/2019   CREATININE 1.20 05/14/2016   CREATININE 1.01 03/02/2016   Lab Results  Component Value Date   AST 35 01/12/2020   AST 63 (H) 07/01/2019   AST 42 (H) 03/02/2019   ALT 73 (H) 03/27/2020   ALT 43 01/12/2020   ALT 66 (H) 07/01/2019    Assessment: Mark [name] presents to clinic today for his 32-month  hepatitis C follow-up. He has been taking Harvoni everyday and has only missed a couple of doses when he was getting in the routine of taking them. He takes Harvoni every evening after work. He stated it has made him feel tired, but I told him that should fade. He has already received his second month of Harvoni. He also reports taking Biktarvy everyday without any issues. As he is due for some HIV follow-up labs, will check BMet, CBC, HIV VL, and CD4 count. Will have him follow up with [name] at the end of his hepatitis C treatment.  He complains of bilateral arm pain and tingling today without relief from ibuprofen or Tylenol. I told him he could also try warm compresses to see if those help. 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  He received his first Pfizer COVID-19 vaccine on 10/15 and is now due for his second shot. He initially had discussed getting the J&J vaccine and stated he felt sick with migraines and nausea after getting the first Pfizer shot. He said he was willing to get his second Pfizer shot this Friday before work. His shift starts at 10am, so I told him he could come to clinic before then.   Plan: Check HCV RNA, BMet, CBC, HIV VL, CD4 count Follow-up for Pfizer vaccine #2 on 11/12 Follow-up with [name] on 10/11/20 @ 2:30PM  [name], PharmD PGY2 ID Pharmacy Resident [phone] 08/08/2020, 3:49 PM

## 2020-08-08 NOTE — Telephone Encounter (Signed)
Patient called, worried he is having a reaction to the covid vaccine 3.5 weeks ago.  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  He describes it as stretching and aching, rates it as 7/10 when he turns his hands medially. He tried ibuprofen and tylenol once, did not help.  He works 7+ hours/day 6 days a week. RN advised that this is not related to the covid vaccine, could be muscle pain or something else. He states he hasn't had a call returned from primary care yet, but understands the importance of establishing primary care. He states he will go to urgent care after his visit with pharmacy today.

## 2020-08-09 LAB — T-HELPER CELL (CD4) - (RCID CLINIC ONLY)
CD4 % Helper T Cell: 32 % — ABNORMAL LOW (ref 33–65)
CD4 T Cell Abs: 555 /uL (ref 400–1790)

## 2020-08-11 ENCOUNTER — Ambulatory Visit: Payer: Self-pay

## 2020-08-14 LAB — HIV-1 RNA QUANT-NO REFLEX-BLD
HIV 1 RNA Quant: <20 Copies/mL
HIV-1 RNA Quant, Log: <1.3 Log cps/mL

## 2020-08-14 LAB — CBC
HCT: 47.3 % (ref 38.5–50.0)
Hemoglobin: 16.6 g/dL (ref 13.2–17.1)
MCH: 31.7 pg (ref 27.0–33.0)
MCHC: 35.1 g/dL (ref 32.0–36.0)
MCV: 90.4 fL (ref 80.0–100.0)
MPV: 9.7 fL (ref 7.5–12.5)
Platelets: 246 10*3/uL (ref 140–400)
RBC: 5.23 10*6/uL (ref 4.20–5.80)
RDW: 12.8 % (ref 11.0–15.0)
WBC: 4.3 10*3/uL (ref 3.8–10.8)

## 2020-08-14 LAB — BASIC METABOLIC PANEL
BUN: 8 mg/dL (ref 7–25)
CO2: 29 mmol/L (ref 20–32)
Calcium: 9.9 mg/dL (ref 8.6–10.3)
Chloride: 102 mmol/L (ref 98–110)
Creat: 1.11 mg/dL (ref 0.60–1.35)
Glucose, Bld: 79 mg/dL (ref 65–99)
Potassium: 4 mmol/L (ref 3.5–5.3)
Sodium: 138 mmol/L (ref 135–146)

## 2020-08-14 LAB — HEPATITIS C RNA QUANTITATIVE
HCV RNA, PCR, QN (Log): <1.18 log IU/mL
HCV RNA, PCR, QN: <15 IU/mL

## 2020-10-06 IMAGING — DX CHEST - 2 VIEW
2 series · 2 of 2 positions shown · non-contrast
Comparison: 09/03/2016

CLINICAL DATA: Cough over the last 10 days. Hemoptysis today. HIV
positive.

EXAM:
CHEST - 2 VIEW

[chest pa]
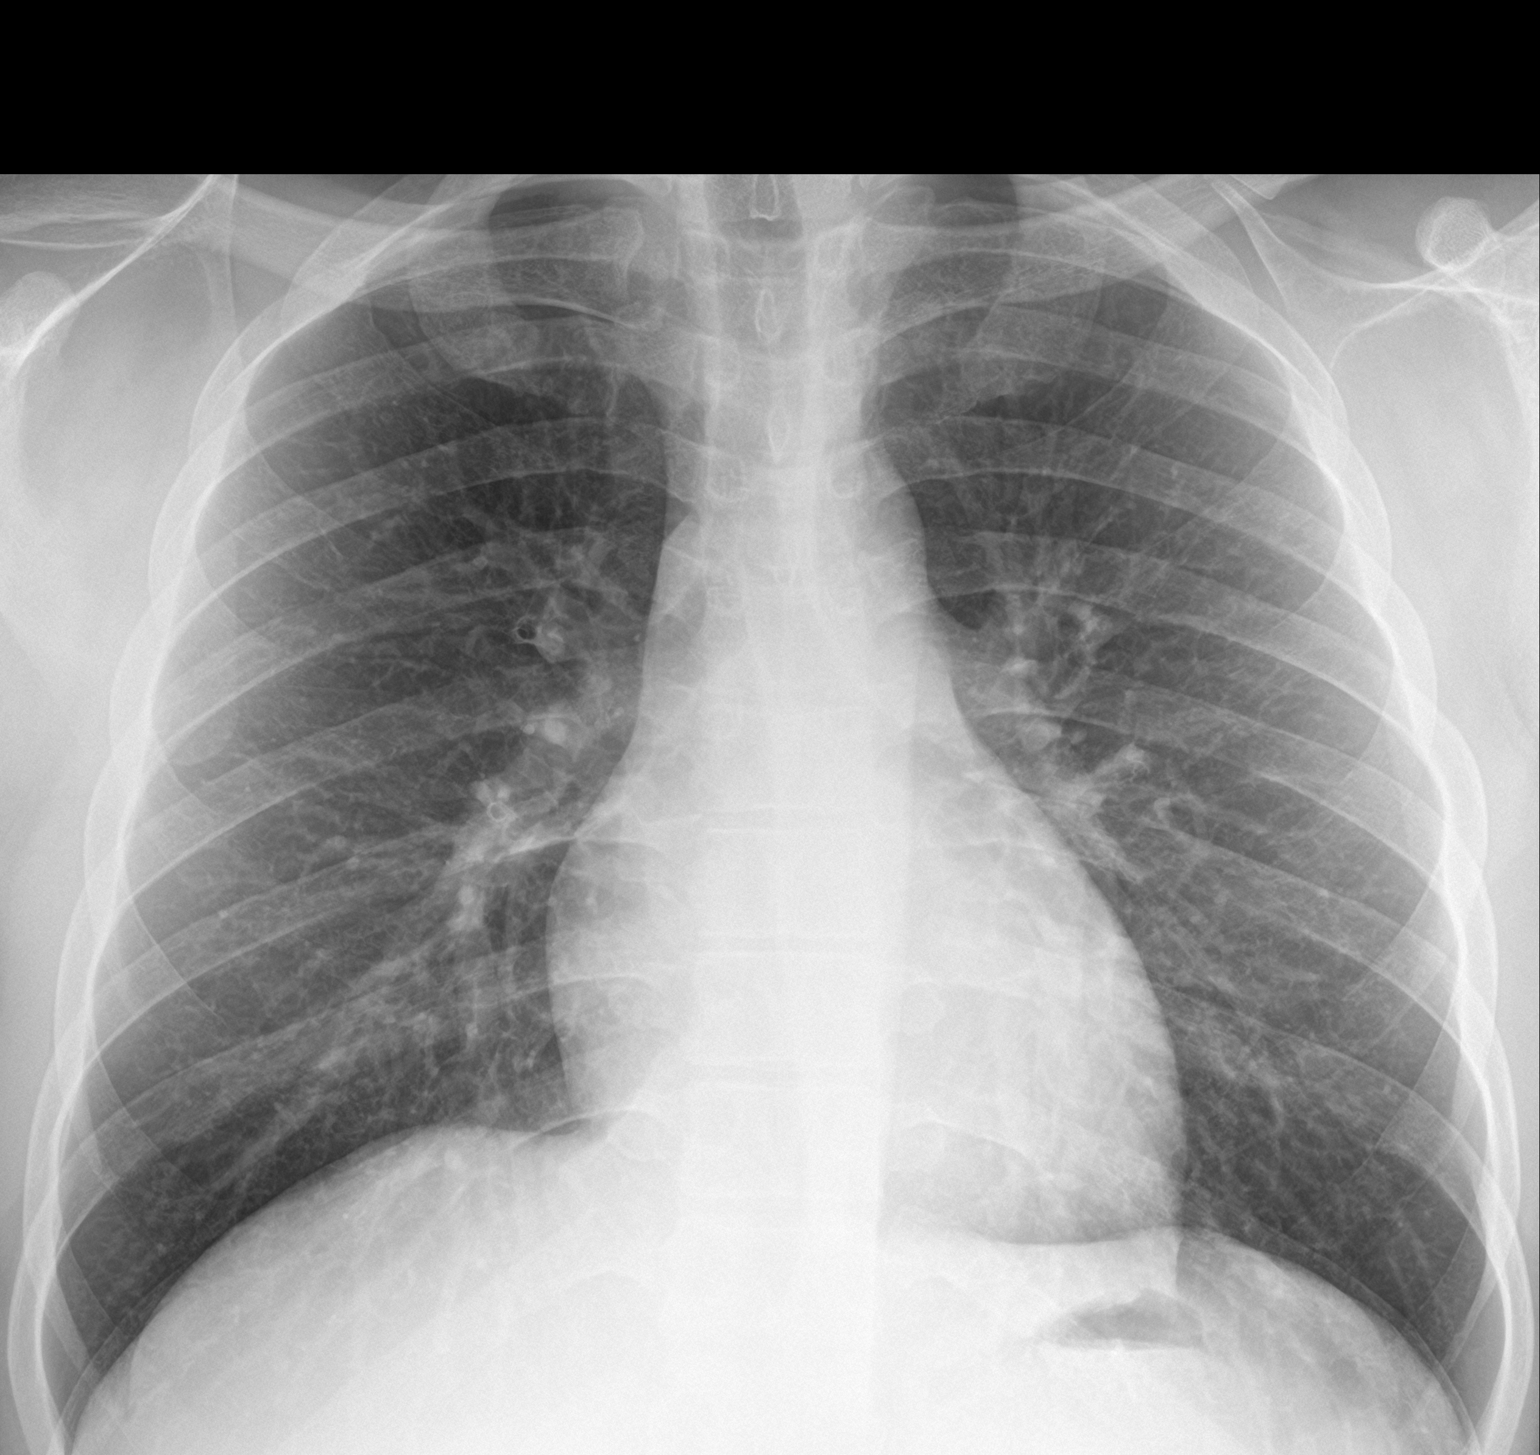

[chest lat]
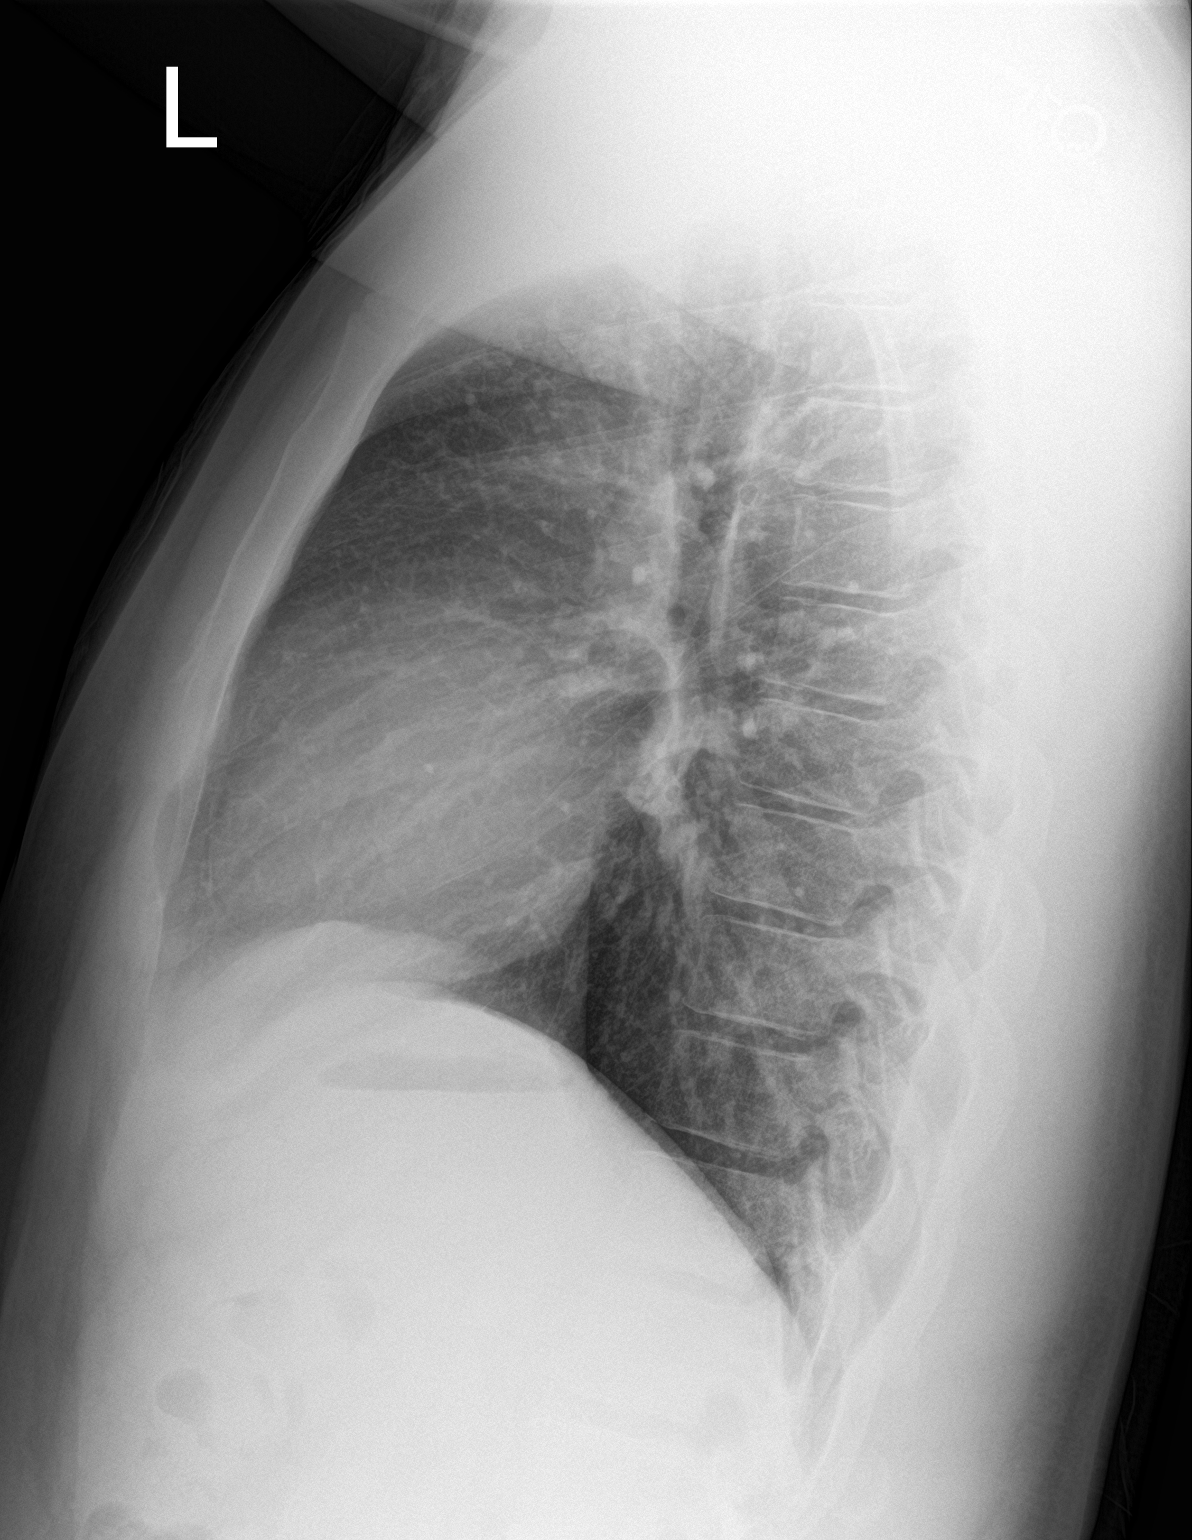

[2 of 2 positions shown; findings below may reference images not displayed]

FINDINGS: Mild central bronchial thickening suggests bronchitis. No
infiltrate, collapse or effusion. No bone abnormality.
IMPRESSION: Bronchitis pattern.  No consolidation or collapse.

## 2020-10-11 ENCOUNTER — Ambulatory Visit (INDEPENDENT_AMBULATORY_CARE_PROVIDER_SITE_OTHER): Payer: Self-pay | Admitting: Infectious Diseases

## 2020-10-11 ENCOUNTER — Encounter: Payer: Self-pay | Admitting: Infectious Diseases

## 2020-10-11 ENCOUNTER — Ambulatory Visit: Payer: Self-pay

## 2020-10-11 ENCOUNTER — Other Ambulatory Visit: Payer: Self-pay

## 2020-10-11 VITALS — BP 122/83 | HR 76 | Resp 16 | Ht 72.0 in | Wt 207.6 lb

## 2020-10-11 DIAGNOSIS — Z21 Asymptomatic human immunodeficiency virus [HIV] infection status: Secondary | ICD-10-CM

## 2020-10-11 DIAGNOSIS — B182 Chronic viral hepatitis C: Secondary | ICD-10-CM

## 2020-10-11 DIAGNOSIS — Z599 Problem related to housing and economic circumstances, unspecified: Secondary | ICD-10-CM

## 2020-10-11 DIAGNOSIS — Z59819 Housing instability, housed unspecified: Secondary | ICD-10-CM | POA: Insufficient documentation

## 2020-10-11 NOTE — Assessment & Plan Note (Signed)
Will have him meet with [facility] today to help with support and resources.

## 2020-10-11 NOTE — Assessment & Plan Note (Signed)
Well controlled on Biktarvy. Will have him meet with financial team to re-enroll in ADAP for next interval period.

## 2020-10-11 NOTE — Assessment & Plan Note (Addendum)
He has lapsed his Harvoni and has about 3 more weeks than expected at this time. Will check hep c rna today to determine next course of action. If he is undetectable will continue daily through completion and see back in 6 weeks. If he has relapsing infection will stop and re-organize timing of second attempt in the future.  He is OK with that plan.

## 2020-10-11 NOTE — Progress Notes (Signed)
Name: Mark [name]  DOB: 1991-02-17 MRN: [id number] PCP: Patient, No Pcp Per    Patient Active Problem List   Diagnosis Date Noted  . Housing situation unstable 10/11/2020  . Vaccine counseling 06/27/2020  . HIV (human immunodeficiency virus infection) ([place]) 02/07/2020  . Chronic hepatitis C without hepatic coma ([place]) 02/07/2020  . Anxiety 02/07/2020  . Bipolar 1 disorder, depressed ([place]) 12/20/2014  . Systolic murmur 80/11/4915     Brief Narrative:  Mark [name] is a 30 y.o. male  is a 30 y.o. with HIV disease, Dx 11-2013.  CD4 nadir > 200 per report of patient  HIV Risk: MSM History of OIs: none Intake Labs 12-2019: Hep B sAg (-), sAb (), cAb (); Hep A (), Hep C (+) Quantiferon (2017 --> negative) HLA B*5701 (-) G6PD: ()   Previous Regimens: . Complera  . Stribild  . Biktarvy    Genotypes: . Not done     Subjective:   CC:  HIV  He still has 1 whole bottle of Harvoni left.     HPI: He states he has had a lot of uncertainty with his home situation. High stress with losing his job and getting evicted. Facing eviction with his grandmother and very stressed about housing options for the future. 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   Has been getting his biktarvy in everyday but has a whole bottle of harvoni left. He says he has missed several doses of medication due to flux of what's been going on at home. Cannot recall how many doses he missed - scattered.     Review of Systems  Constitutional: Negative for chills, fever, malaise/fatigue and weight loss.  HENT: Negative for sore throat.   Respiratory: Negative for cough, sputum production and shortness of breath.   Cardiovascular: Negative.   Gastrointestinal: Negative for abdominal pain, diarrhea and vomiting.  Musculoskeletal: Negative for joint pain, myalgias and neck pain.  Skin: Negative for rash.  Neurological: Negative for headaches.  Psychiatric/Behavioral: Negative for  depression and substance abuse. The patient is not nervous/anxious.      Past Medical History:  Diagnosis Date  . Bipolar 1 disorder ([place])   . Hepatitis C   . HIV (human immunodeficiency virus infection) ([place])     Outpatient Medications Prior to Visit  Medication Sig Dispense Refill  . acetaminophen (TYLENOL) 325 MG tablet Take 650 mg by mouth every 6 (six) hours as needed.    . bictegravir-emtricitabine-tenofovir AF (BIKTARVY) 50-200-25 MG TABS tablet Take 1 tablet by mouth daily. 30 tablet 5  . ibuprofen (ADVIL) 200 MG tablet Take 200 mg by mouth every 6 (six) hours as needed.    . Ledipasvir-Sofosbuvir (HARVONI) 90-400 MG TABS Take by mouth.    . Ledipasvir-Sofosbuvir (HARVONI) 90-400 MG TABS Take 1 tablet by mouth daily. 28 tablet 2   No facility-administered medications prior to visit.     No Known Allergies  Social History   Tobacco Use  . Smoking status: Current Every Day Smoker    Packs/day: 0.40  . Smokeless tobacco: Never Used  Substance Use Topics  . Alcohol use: Yes    Alcohol/week: 2.0 standard drinks    Types: 2 Glasses of wine per week    Comment: occassionally  . Drug use: Yes    Frequency: 7.0 times per week    Types: Marijuana    Family History  Problem Relation Age of Onset  . Healthy Mother   . Cancer Maternal  Grandmother   . Kidney failure Maternal Grandmother   . Diabetes Other     Social History   Substance and Sexual Activity  Sexual Activity Yes  . Partners: Male     Objective:   Vitals:   10/11/20 1436  BP: 122/83  Pulse: 76  Resp: 16  SpO2: 98%  Weight: 207 lb 9.6 oz (94.2 kg)  Height: 6' (1.829 m)   Body mass index is 28.16 kg/m.   Physical Exam HENT:     Mouth/Throat:     Mouth: No oral lesions.     Dentition: Normal dentition. No dental caries.  Eyes:     General: No scleral icterus. Cardiovascular:     Rate and Rhythm: Normal rate and regular rhythm.     Heart sounds: Normal heart sounds.  Pulmonary:      Effort: Pulmonary effort is normal.     Breath sounds: Normal breath sounds.  Abdominal:     General: There is no distension.     Palpations: Abdomen is soft.     Tenderness: There is no abdominal tenderness.  Lymphadenopathy:     Cervical: No cervical adenopathy.  Skin:    General: Skin is warm and dry.     Findings: No rash.  Neurological:     Mental Status: He is alert and oriented to person, place, and time.     Lab Results Lab Results  Component Value Date   WBC 4.3 08/08/2020   HGB 16.6 08/08/2020   HCT 47.3 08/08/2020   MCV 90.4 08/08/2020   PLT 246 08/08/2020    Lab Results  Component Value Date   CREATININE 1.11 08/08/2020   BUN 8 08/08/2020   NA 138 08/08/2020   K 4.0 08/08/2020   CL 102 08/08/2020   CO2 29 08/08/2020    Lab Results  Component Value Date   ALT 73 (H) 03/27/2020   AST 35 01/12/2020   GGT 114 (H) 03/27/2020   ALKPHOS 70 07/01/2019   BILITOT 0.6 01/12/2020    Lab Results  Component Value Date   CHOL 166 01/12/2020   HDL 39 (L) 01/12/2020   LDLCALC 109 (H) 01/12/2020   TRIG 86 01/12/2020   CHOLHDL 4.3 01/12/2020   HIV 1 RNA Quant  Date Value  08/08/2020 <20 Copies/mL  03/27/2020 <20 NOT DETECTED copies/mL  01/12/2020 1,300 copies/mL (H)   CD4 T Cell Abs (/uL)  Date Value  08/08/2020 555  01/12/2020 460  05/14/2016 470     Assessment & Plan:   Problem List Items Addressed This Visit      Unprioritized   Housing situation unstable    Will have him meet with [facility] today to help with support and resources.       HIV (human immunodeficiency virus infection) ([place]) (Chronic)    Well controlled on Biktarvy. Will have him meet with financial team to re-enroll in ADAP for next interval period.       Relevant Medications   Ledipasvir-Sofosbuvir (HARVONI) 90-400 MG TABS   Chronic hepatitis C without hepatic coma (HCC) (Chronic)    He has lapsed his Harvoni and has about 3 more weeks than expected at this time. Will check hep c  rna today to determine next course of action. If he is undetectable will continue daily through completion and see back in 6 weeks. If he has relapsing infection will stop and re-organize timing of second attempt in the future.  He is OK with that plan.  Relevant Medications   Ledipasvir-Sofosbuvir (HARVONI) 90-400 MG TABS   Other Relevant Orders   Hepatitis C RNA quantitative ([facility])    Other Visit Diagnoses    Asymptomatic HIV infection ([place])    -  Primary   Relevant Medications   Ledipasvir-Sofosbuvir (HARVONI) 90-400 MG TABS      Janene Madeira, MSN, NP-C [facility] [place] Pager: [phone] Office: [phone]

## 2020-10-11 NOTE — Patient Instructions (Signed)
Please stop by the lab on your way out today to make sure we don't need to change the Harvoni medication.   Please continue the biktarvy and harvoni for now. If we need to change I will call you on your cell or send you a MyChart.   Would like to see you back in 6 weeks.   Please stop by the financial team today to see what you need to to do re-enroll in ADAP for the next period.

## 2020-10-16 LAB — HEPATITIS C RNA QUANTITATIVE
HCV Quantitative Log: <1.18 log IU/mL
HCV RNA, PCR, QN: <15 IU/mL

## 2020-10-18 ENCOUNTER — Telehealth: Payer: Self-pay | Admitting: *Deleted

## 2020-10-18 NOTE — Progress Notes (Signed)
Please call Mark [name] to let him know that his hepatitis c levels are still undetectable I want him to continue his Harvoni everyday until gone. It would be best if he could try to miss no doses and get every pill in. I am hopeful we can cure his hepatitis C with this first try.

## 2020-10-18 NOTE — Telephone Encounter (Signed)
-----   Message from Blanchard Kelch, NP sent at 10/18/2020  9:34 AM EST ----- Please call Mark [name] to let him know that his hepatitis c levels are still undetectable I want him to continue his Harvoni everyday until gone. It would be best if he could try to miss no doses and get every pill in. I am hopeful we can cure his hepatitis C with this first try.

## 2020-10-18 NOTE — Telephone Encounter (Signed)
Perfect, thank you

## 2020-10-18 NOTE — Telephone Encounter (Signed)
Relayed to patient. He is doing well, just started his last bottle, will continue to take daily. Is scheduled to follow up 2/17. Andree Coss, RN

## 2020-11-10 ENCOUNTER — Emergency Department (HOSPITAL_COMMUNITY)
Admission: EM | Admit: 2020-11-10 | Discharge: 2020-11-11 | Disposition: A | Discharge status: Home / Self Care | Payer: Self-pay | Attending: Emergency Medicine | Admitting: Emergency Medicine

## 2020-11-10 ENCOUNTER — Other Ambulatory Visit: Payer: Self-pay

## 2020-11-10 DIAGNOSIS — F17200 Nicotine dependence, unspecified, uncomplicated: Secondary | ICD-10-CM | POA: Insufficient documentation

## 2020-11-10 DIAGNOSIS — Z21 Asymptomatic human immunodeficiency virus [HIV] infection status: Secondary | ICD-10-CM | POA: Insufficient documentation

## 2020-11-10 DIAGNOSIS — J029 Acute pharyngitis, unspecified: Secondary | ICD-10-CM | POA: Insufficient documentation

## 2020-11-10 DIAGNOSIS — L0211 Cutaneous abscess of neck: Secondary | ICD-10-CM | POA: Insufficient documentation

## 2020-11-10 NOTE — ED Triage Notes (Signed)
Pt presents to ED POV. Pt c/o abscess on throat. Pt denies any fevers at home. Airway intact

## 2020-11-11 MED ORDER — DOXYCYCLINE HYCLATE 100 MG PO CAPS: 100.0000 mg | ORAL_CAPSULE | Freq: Two times a day (BID) | ORAL | 0 refills | Status: DC

## 2020-11-11 MED ORDER — LIDOCAINE-EPINEPHRINE 1 %-1:100000 IJ SOLN
10.0000 mL | Freq: Once | INTRAMUSCULAR | Status: AC
  Administered 2020-11-11: 10 mL via INTRADERMAL
  Filled 2020-11-11: qty 1

## 2020-11-11 MED ORDER — DOXYCYCLINE HYCLATE 100 MG PO TABS
100.0000 mg | ORAL_TABLET | Freq: Once | ORAL | Status: AC
  Administered 2020-11-11: 100 mg via ORAL
  Filled 2020-11-11: qty 1

## 2020-11-11 NOTE — Discharge Instructions (Signed)
Warm compresses at least 4 times a day to keep it open and draining.  Please return for rapid spreading redness or if you develop a fever.  Please follow-up with your doctor in the office.

## 2020-11-11 NOTE — ED Provider Notes (Signed)
[facility] EMERGENCY DEPARTMENT Provider Note   CSN: [id number] Arrival date & time: 11/10/20  2136     History Chief Complaint  Patient presents with  . Sore Throat    Mark [name] is a 30 y.o. male.  30 yo M with a chief complaints of an abscess to the left side of his neck. This been going on for a few days now. Has had some drainage off and on. He was sent home from work because he had some drainage at work and they told him it need to be taken care of before he returned. No fevers or chills. No intraoral pathology. No trauma. He has had some hairs come out of the abscess.  The history is provided by the patient.  Sore Throat Pertinent negatives include no chest pain, no abdominal pain, no headaches and no shortness of breath.  Abscess Location:  Head/neck Head/neck abscess location:  L neck Size:  Quarter Abscess quality: draining, fluctuance, induration and painful   Red streaking: no   Duration:  2 days Progression:  Worsening Pain details:    Quality:  Aching   Severity:  Moderate   Duration:  2 days Associated symptoms: no fever, no headaches and no vomiting        Past Medical History:  Diagnosis Date  . Bipolar 1 disorder (HCC)   . Hepatitis C   . HIV (human immunodeficiency virus infection) [facility])     Patient Active Problem List   Diagnosis Date Noted  . Housing situation unstable 10/11/2020  . Vaccine counseling 06/27/2020  . HIV (human immunodeficiency virus infection) (HCC) 02/07/2020  . Chronic hepatitis C without hepatic coma (HCC) 02/07/2020  . Anxiety 02/07/2020  . Bipolar 1 disorder, depressed (HCC) 12/20/2014  . Systolic murmur 12/20/2014    No past surgical history on file.     Family History  Problem Relation Age of Onset  . Healthy Mother   . Cancer Maternal Grandmother   . Kidney failure Maternal Grandmother   . Diabetes Other     Social History   Tobacco Use  . Smoking status: Current Every Day Smoker     Packs/day: 0.40  . Smokeless tobacco: Never Used  Substance Use Topics  . Alcohol use: Yes    Alcohol/week: 2.0 standard drinks    Types: 2 Glasses of wine per week    Comment: occassionally  . Drug use: Yes    Frequency: 7.0 times per week    Types: Marijuana    Home Medications Prior to Admission medications   Medication Sig Start Date End Date Taking? Authorizing Provider  doxycycline (VIBRAMYCIN) 100 MG capsule Take 1 capsule (100 mg total) by mouth 2 (two) times daily. One po bid x 7 days 11/11/20  Yes Melene Plan, DO  acetaminophen (TYLENOL) 325 MG tablet Take 650 mg by mouth every 6 (six) hours as needed.    [provider]  bictegravir-emtricitabine-tenofovir AF (BIKTARVY) 50-200-25 MG TABS tablet Take 1 tablet by mouth daily. 02/07/20   Blanchard Kelch, NP  ibuprofen (ADVIL) 200 MG tablet Take 200 mg by mouth every 6 (six) hours as needed.    [provider]  Ledipasvir-Sofosbuvir (HARVONI) 90-400 MG TABS Take by mouth.    [provider]    Allergies    Patient has no known allergies.  Review of Systems   Review of Systems  Constitutional: Negative for chills and fever.  HENT: Negative for congestion and facial swelling.   Eyes:  Negative for discharge and visual disturbance.  Respiratory: Negative for shortness of breath.   Cardiovascular: Negative for chest pain and palpitations.  Gastrointestinal: Negative for abdominal pain, diarrhea and vomiting.  Musculoskeletal: Negative for arthralgias and myalgias.  Skin: Positive for wound. Negative for color change and rash.  Neurological: Negative for tremors, syncope and headaches.  Psychiatric/Behavioral: Negative for confusion and dysphoric mood.    Physical Exam Updated Vital Signs BP 116/82 (BP Location: Left Arm)   Pulse 84   Temp 98.9 F (37.2 C) (Oral)   Resp 15   SpO2 98%   Physical Exam Vitals and nursing note reviewed.  Constitutional:      Appearance: He is  well-developed and well-nourished.  HENT:     Head: Normocephalic and atraumatic.   Eyes:     Extraocular Movements: EOM normal.     Pupils: Pupils are equal, round, and reactive to light.  Neck:     Vascular: No JVD.  Cardiovascular:     Rate and Rhythm: Normal rate and regular rhythm.     Heart sounds: No murmur heard. No friction rub. No gallop.   Pulmonary:     Effort: No respiratory distress.     Breath sounds: No wheezing.  Abdominal:     General: There is no distension.     Tenderness: There is no guarding or rebound.  Musculoskeletal:        General: Normal range of motion.     Cervical back: Normal range of motion and neck supple.  Skin:    Coloration: Skin is not pale.     Findings: No rash.  Neurological:     Mental Status: He is alert and oriented to person, place, and time.  Psychiatric:        Mood and Affect: Mood and affect normal.        Behavior: Behavior normal.     ED Results / Procedures / Treatments   Labs (all labs ordered are listed, but only abnormal results are displayed) Labs Reviewed  AEROBIC CULTURE (SUPERFICIAL SPECIMEN)    EKG None  Radiology No results found.  Procedures .Marland KitchenIncision and Drainage  Date/Time: 11/11/2020 1:52 AM Performed by: Melene Plan, DO Authorized by: Melene Plan, DO   Consent:    Consent obtained:  Verbal   Consent given by:  Patient   Risks, benefits, and alternatives were discussed: yes     Risks discussed:  Bleeding, incomplete drainage, damage to other organs and infection   Alternatives discussed:  No treatment, delayed treatment and alternative treatment Universal protocol:    Procedure explained and questions answered to patient or proxy's satisfaction: yes     Immediately prior to procedure, a time out was called: yes     Patient identity confirmed:  Arm band Location:    Type:  Abscess   Size:  Quarter   Location:  Neck   Neck location:  L anterior Pre-procedure details:    Skin preparation:   Chlorhexidine Sedation:    Sedation type:  None Anesthesia:    Anesthesia method:  Local infiltration   Local anesthetic:  Lidocaine 2% WITH epi Procedure type:    Complexity:  Complex Procedure details:    Ultrasound guidance: yes     Needle aspiration: no     Incision types:  Single straight   Incision depth:  Subcutaneous   Wound management:  Probed and deloculated   Drainage:  Bloody, purulent, serosanguinous and serous   Drainage amount:  Moderate  Wound treatment:  Wound left open   Packing materials:  None Post-procedure details:    Procedure completion:  Tolerated well, no immediate complications Comments:     Complex his appearing fluid collection on ultrasound.  Larger than expected area affected with probing and deloculating.    Emergency Focused Ultrasound Exam Limited Ultrasound of Soft Tissue   Performed and interpreted by Dr. Adela Lank Indication: evaluation for infection or foreign body Transverse and Sagittal views of L anterior neck are obtained in real time for the purposes of evaluation of skin and underlying soft tissues.  Findings:  heterogeneous fluid collection, with hyperemia/edema of surrounding tissue Interpretation:  abscess, with cellulitis Images archived electronically.  CPT Codes:  Neck [id number]   Medications Ordered in ED Medications  lidocaine-EPINEPHrine (XYLOCAINE W/EPI) 1 %-1:100000 (with pres) injection 10 mL (10 mLs Intradermal Given 11/11/20 0112)  doxycycline (VIBRA-TABS) tablet 100 mg (100 mg Oral Given 11/11/20 0112)    ED Course  I have reviewed the triage vital signs and the nursing notes.  Pertinent labs & imaging results that were available during my care of the patient were reviewed by me and considered in my medical decision making (see chart for details).    MDM Rules/Calculators/A&P                          30 yo M with a chief complaints of an abscess to the left side of his neck. Going on for about a week or so off  and on. Had some drainage off and on. Bedside ultrasound consistent with the fluid collection. Discussed with the patient risks and benefits of drainage electing for drainage. Will start on antibiotics with his history of HIV and prolonged course of illness.  Wound culture sent as well.  ID follow-up.  1:54 AM:  I have discussed the diagnosis/risks/treatment options with the patient and believe the pt to be eligible for discharge home to follow-up with ID. We also discussed returning to the ED immediately if new or worsening sx occur. We discussed the sx which are most concerning (e.g., sudden worsening pain, fever, inability to tolerate by mouth, rapid spreading redness) that necessitate immediate return. Medications administered to the patient during their visit and any new prescriptions provided to the patient are listed below.  Medications given during this visit Medications  lidocaine-EPINEPHrine (XYLOCAINE W/EPI) 1 %-1:100000 (with pres) injection 10 mL (10 mLs Intradermal Given 11/11/20 0112)  doxycycline (VIBRA-TABS) tablet 100 mg (100 mg Oral Given 11/11/20 0112)     The patient appears reasonably screen and/or stabilized for discharge and I doubt any other medical condition or other [facility] requiring further screening, evaluation, or treatment in the ED at this time prior to discharge.   Final Clinical Impression(s) / ED Diagnoses Final diagnoses:  Neck abscess    Rx / DC Orders ED Discharge Orders         Ordered    doxycycline (VIBRAMYCIN) 100 MG capsule  2 times daily        11/11/20 0127           Melene Plan, DO 11/11/20 0154

## 2020-11-13 ENCOUNTER — Telehealth: Payer: Self-pay

## 2020-11-13 LAB — AEROBIC CULTURE W GRAM STAIN (SUPERFICIAL SPECIMEN): Gram Stain: NONE SEEN

## 2020-11-13 NOTE — Telephone Encounter (Signed)
Received voicemail from patient, he states he was recently admitted to the hospital for a large skin abscess on his neck. He was told he needs to follow up with our office, he already has an appointment scheduled for 11/16/20, he is wondering if he needs to be seen sooner. Per Rexene Alberts, NP the patient can continue as planned with his appointment on 2/17 as he has antibiotics until then and cultures are still pending. RN attempted to call patient back to relay this message, no answer. Unable to leave voicemail.   Sandie Ano, RN

## 2020-11-16 ENCOUNTER — Ambulatory Visit: Payer: Self-pay | Admitting: Infectious Diseases

## 2020-11-22 ENCOUNTER — Encounter: Payer: Self-pay | Admitting: Infectious Diseases

## 2020-12-07 ENCOUNTER — Ambulatory Visit (INDEPENDENT_AMBULATORY_CARE_PROVIDER_SITE_OTHER): Payer: Self-pay | Admitting: Infectious Diseases

## 2020-12-07 ENCOUNTER — Encounter: Payer: Self-pay | Admitting: Infectious Diseases

## 2020-12-07 ENCOUNTER — Other Ambulatory Visit: Payer: Self-pay

## 2020-12-07 VITALS — BP 139/88 | HR 57 | Temp 98.0°F | Ht 72.0 in | Wt 207.0 lb

## 2020-12-07 DIAGNOSIS — Z59819 Housing instability, housed unspecified: Secondary | ICD-10-CM

## 2020-12-07 DIAGNOSIS — Z7185 Encounter for immunization safety counseling: Secondary | ICD-10-CM

## 2020-12-07 DIAGNOSIS — Z599 Problem related to housing and economic circumstances, unspecified: Secondary | ICD-10-CM

## 2020-12-07 DIAGNOSIS — B182 Chronic viral hepatitis C: Secondary | ICD-10-CM

## 2020-12-07 DIAGNOSIS — Z113 Encounter for screening for infections with a predominantly sexual mode of transmission: Secondary | ICD-10-CM

## 2020-12-07 DIAGNOSIS — Z23 Encounter for immunization: Secondary | ICD-10-CM

## 2020-12-07 DIAGNOSIS — Z21 Asymptomatic human immunodeficiency virus [HIV] infection status: Secondary | ICD-10-CM

## 2020-12-07 MED ORDER — BIKTARVY 50-200-25 MG PO TABS
1.0000 | ORAL_TABLET | Freq: Every day | ORAL | 5 refills | Status: DC
Start: 2020-12-07 — End: 2021-03-29

## 2020-12-07 NOTE — Assessment & Plan Note (Signed)
Undetectable despite lapse in Harvoni of a few weeks. He completed his last bottle to finish out 3 months of therapy.  Will check EOT hep c rna today and SVR in 3 months at return. F0 so no concern for ongoing risk for HCC screenings.

## 2020-12-07 NOTE — Progress Notes (Signed)
Name: Mark [name]  DOB: December 13, 1990 MRN: [id number] PCP: Patient, No Pcp Per     Brief Narrative:  Mark [name] is a 30 y.o. male  is a 30 y.o. with HIV disease, Dx 11-2013.  CD4 nadir > 200 per report of patient  HIV Risk: MSM History of OIs: none Intake Labs 12-2019: Hep B sAg (-), sAb (), cAb (); Hep A (), Hep C (+) Harvoni x 12 weeks (06/2020 - 10/2020) Quantiferon (2017 --> negative) HLA B*5701 (-) G6PD: ()   Previous Regimens: . Complera  . Stribild  . Biktarvy    Genotypes: . Not done     Subjective:   CC:  HIV  He still has 1 whole bottle of Harvoni left.     HPI: Mark [name] is doing well - his boyfriend and his mother are here today; permission to discuss healthcare visit in their presence.   Doing well on Biktarvy taking it once a day without missed doses. Met with financial team for re-enrollment in February. Completed Harvoni shortly after our last visit together to complete 12 weeks of treatment. Tolerated well with a lapse of a few weeks d/t housing issues and stress.   ER visit in February for neck absess - popped up all of a sudden. This started after he had shaved his neck with a razor then after trying to pop it and handle it with hands it grew big quickly. He significantly improved following I&D and antibiotics. He has a small bump of scar tissue leftover but he reports when it first came up.   Working with [employer] for housing currently. Living with BF's mom.     Review of Systems  Constitutional: Negative for chills, fever, malaise/fatigue and weight loss.  HENT: Negative for sore throat.   Respiratory: Negative for cough, sputum production and shortness of breath.   Cardiovascular: Negative.   Gastrointestinal: Negative for abdominal pain, diarrhea and vomiting.  Musculoskeletal: Negative for joint pain, myalgias and neck pain.  Skin: Negative for rash.  Neurological: Negative for headaches.  Psychiatric/Behavioral: Negative for depression and  substance abuse. The patient is not nervous/anxious.      Past Medical History:  Diagnosis Date  . Bipolar 1 disorder ([place])   . Hepatitis C   . HIV (human immunodeficiency virus infection) ([place])     Outpatient Medications Prior to Visit  Medication Sig Dispense Refill  . acetaminophen (TYLENOL) 325 MG tablet Take 650 mg by mouth every 6 (six) hours as needed.    . doxycycline (VIBRAMYCIN) 100 MG capsule Take 1 capsule (100 mg total) by mouth 2 (two) times daily. One po bid x 7 days 14 capsule 0  . ibuprofen (ADVIL) 200 MG tablet Take 200 mg by mouth every 6 (six) hours as needed.    . bictegravir-emtricitabine-tenofovir AF (BIKTARVY) 50-200-25 MG TABS tablet Take 1 tablet by mouth daily. 30 tablet 5  . Ledipasvir-Sofosbuvir 90-400 MG TABS Take by mouth.     No facility-administered medications prior to visit.     No Known Allergies  Social History   Tobacco Use  . Smoking status: Current Every Day Smoker    Packs/day: 0.40  . Smokeless tobacco: Never Used  Substance Use Topics  . Alcohol use: Yes    Alcohol/week: 2.0 standard drinks    Types: 2 Glasses of wine per week    Comment: occassionally  . Drug use: Yes    Frequency: 7.0 times per week    Types: Marijuana    Family  History  Problem Relation Age of Onset  . Healthy Mother   . Cancer Maternal Grandmother   . Kidney failure Maternal Grandmother   . Diabetes Other     Social History   Substance and Sexual Activity  Sexual Activity Yes  . Partners: Male     Objective:   Vitals:   12/07/20 0854  BP: 139/88  Pulse: (!) 57  Temp: 98 F (36.7 C)  Weight: 207 lb (93.9 kg)  Height: 6' (1.829 m)   Body mass index is 28.07 kg/m.   Physical Exam HENT:     Mouth/Throat:     Mouth: No oral lesions.     Dentition: Normal dentition. No dental caries.  Eyes:     General: No scleral icterus. Neck:     Comments: Small well healed scar under the left chin.  Cardiovascular:     Rate and Rhythm: Normal  rate and regular rhythm.     Heart sounds: Normal heart sounds.  Pulmonary:     Effort: Pulmonary effort is normal.     Breath sounds: Normal breath sounds.  Abdominal:     General: There is no distension.     Palpations: Abdomen is soft.     Tenderness: There is no abdominal tenderness.  Lymphadenopathy:     Cervical: No cervical adenopathy.  Skin:    General: Skin is warm and dry.     Findings: No rash.  Neurological:     Mental Status: He is alert and oriented to person, place, and time.     Lab Results Lab Results  Component Value Date   WBC 4.3 08/08/2020   HGB 16.6 08/08/2020   HCT 47.3 08/08/2020   MCV 90.4 08/08/2020   PLT 246 08/08/2020    Lab Results  Component Value Date   CREATININE 1.11 08/08/2020   BUN 8 08/08/2020   NA 138 08/08/2020   K 4.0 08/08/2020   CL 102 08/08/2020   CO2 29 08/08/2020    Lab Results  Component Value Date   ALT 73 (H) 03/27/2020   AST 35 01/12/2020   GGT 114 (H) 03/27/2020   ALKPHOS 70 07/01/2019   BILITOT 0.6 01/12/2020    Lab Results  Component Value Date   CHOL 166 01/12/2020   HDL 39 (L) 01/12/2020   LDLCALC 109 (H) 01/12/2020   TRIG 86 01/12/2020   CHOLHDL 4.3 01/12/2020   HIV 1 RNA Quant  Date Value  08/08/2020 <20 Copies/mL  03/27/2020 <20 NOT DETECTED copies/mL  01/12/2020 1,300 copies/mL (H)   CD4 T Cell Abs (/uL)  Date Value  08/08/2020 555  01/12/2020 460  05/14/2016 470     Assessment & Plan:   Problem List Items Addressed This Visit      Unprioritized   Vaccine counseling    Declined flu shot. Accepted prevnar today - will give pneumovax next OV.  COVID booster completed with [place] in October.       Housing situation unstable    Currently housed temporarily with his boyfriend's mother. Working with [employer] case management for something more permanent       HIV (human immunodeficiency virus infection) ([place]) (Chronic)    Discussed pathophys of HIV for his company - answered their questions.  His partner is also on PrEP once daily with Descov[employer].  Mark [name] has been doing well on [place] with undetectable viral loads and adequate recovery of immune system. Will update labs today for routine monitoring including RPR. Unable to provide urine  today - will check next time for routine GC screening.  Vaccines updated.  Has access to dentist through our clinic.   Return in about 3 months (around 03/09/2021).       Relevant Medications   bictegravir-emtricitabine-tenofovir AF (BIKTARVY) 50-200-25 MG TABS tablet   Chronic hepatitis C without hepatic coma (HCC) (Chronic)    Undetectable despite lapse in Harvoni of a few weeks. He completed his last bottle to finish out 3 months of therapy.  Will check EOT hep c rna today and SVR in 3 months at return. F0 so no concern for ongoing risk for [place] screenings.       Relevant Medications   bictegravir-emtricitabine-tenofovir AF (BIKTARVY) 50-200-25 MG TABS tablet   Other Relevant Orders   Hepatitis C RNA quantitative ([facility])    Other Visit Diagnoses    Asymptomatic HIV infection ([place])    -  Primary   Relevant Medications   bictegravir-emtricitabine-tenofovir AF (BIKTARVY) 50-200-25 MG TABS tablet   Other Relevant Orders   HIV-1 RNA quant-no reflex-bld   Routine screening for STI (sexually transmitted infection)       Relevant Orders   RPR      Janene Madeira, MSN, NP-C [facility] for [place] Pager: [phone] Office: [phone]

## 2020-12-07 NOTE — Patient Instructions (Signed)
Nice to see you today.   I am hopeful for you that housing works out soon.   Please stop by the lab on your way out.   We gave you your 1st Pneumonia shot today --> second dose at your next visit.    Please schedule a lab visit in 3 months with an office visit 2 weeks later with me.   Continue your Biktarvy.   STOP your harvoni

## 2020-12-07 NOTE — Assessment & Plan Note (Signed)
Currently housed temporarily with his boyfriend's mother. Working with [employer] case management for something more permanent

## 2020-12-07 NOTE — Assessment & Plan Note (Signed)
Declined flu shot. Accepted prevnar today - will give pneumovax next OV.  COVID booster completed with [employer] in October.

## 2020-12-07 NOTE — Assessment & Plan Note (Signed)
Discussed pathophys of HIV for his company - answered their questions. His partner is also on PrEP once daily with [employer]covy.  Mark [name] has been doing well on Biktarvy with undetectable viral loads and adequate recovery of immune system. Will update labs today for routine monitoring including RPR. Unable to provide urine today - will check next time for routine GC screening.  Vaccines updated.  Has access to dentist through our clinic.   Return in about 3 months (around 03/09/2021).

## 2020-12-10 LAB — HIV-1 RNA QUANT-NO REFLEX-BLD
HIV 1 RNA Quant: 31 Copies/mL — ABNORMAL HIGH
HIV-1 RNA Quant, Log: 1.49 Log cps/mL — ABNORMAL HIGH

## 2020-12-10 LAB — RPR: RPR Ser Ql: NONREACTIVE

## 2020-12-10 LAB — HEPATITIS C RNA QUANTITATIVE
HCV Quantitative Log: <1.18 log IU/mL
HCV RNA, PCR, QN: <15 IU/mL

## 2020-12-13 ENCOUNTER — Ambulatory Visit: Payer: Self-pay

## 2020-12-16 ENCOUNTER — Other Ambulatory Visit: Payer: Self-pay | Admitting: Infectious Diseases

## 2020-12-16 DIAGNOSIS — Z21 Asymptomatic human immunodeficiency virus [HIV] infection status: Secondary | ICD-10-CM

## 2021-03-08 ENCOUNTER — Other Ambulatory Visit: Payer: Self-pay

## 2021-03-08 DIAGNOSIS — B20 Human immunodeficiency virus [HIV] disease: Secondary | ICD-10-CM

## 2021-03-08 DIAGNOSIS — Z113 Encounter for screening for infections with a predominantly sexual mode of transmission: Secondary | ICD-10-CM

## 2021-03-08 DIAGNOSIS — Z79899 Other long term (current) drug therapy: Secondary | ICD-10-CM

## 2021-03-15 ENCOUNTER — Other Ambulatory Visit: Payer: Self-pay

## 2021-03-15 DIAGNOSIS — Z79899 Other long term (current) drug therapy: Secondary | ICD-10-CM

## 2021-03-15 DIAGNOSIS — B20 Human immunodeficiency virus [HIV] disease: Secondary | ICD-10-CM

## 2021-03-16 LAB — T-HELPER CELL (CD4) - (RCID CLINIC ONLY)
CD4 % Helper T Cell: 32 % — ABNORMAL LOW (ref 33–65)
CD4 T Cell Abs: 676 /uL (ref 400–1790)

## 2021-03-18 LAB — COMPREHENSIVE METABOLIC PANEL
AG Ratio: 1.3 (calc) (ref 1.0–2.5)
ALT: 11 U/L (ref 9–46)
AST: 16 U/L (ref 10–40)
Albumin: 4.3 g/dL (ref 3.6–5.1)
Alkaline phosphatase (APISO): 69 U/L (ref 36–130)
BUN: 11 mg/dL (ref 7–25)
CO2: 31 mmol/L (ref 20–32)
Calcium: 9.7 mg/dL (ref 8.6–10.3)
Chloride: 102 mmol/L (ref 98–110)
Creat: 1.16 mg/dL (ref 0.60–1.35)
Globulin: 3.2 g/dL (calc) (ref 1.9–3.7)
Glucose, Bld: 105 mg/dL — ABNORMAL HIGH (ref 65–99)
Potassium: 4.7 mmol/L (ref 3.5–5.3)
Sodium: 139 mmol/L (ref 135–146)
Total Bilirubin: 0.4 mg/dL (ref 0.2–1.2)
Total Protein: 7.5 g/dL (ref 6.1–8.1)

## 2021-03-18 LAB — CBC WITH DIFFERENTIAL/PLATELET
Absolute Monocytes: 470 cells/uL (ref 200–950)
Basophils Absolute: 19 cells/uL (ref 0–200)
Basophils Relative: 0.4 %
Eosinophils Absolute: 221 cells/uL (ref 15–500)
Eosinophils Relative: 4.6 %
HCT: 47.9 % (ref 38.5–50.0)
Hemoglobin: 16.2 g/dL (ref 13.2–17.1)
Lymphs Abs: 2366 cells/uL (ref 850–3900)
MCH: 30.3 pg (ref 27.0–33.0)
MCHC: 33.8 g/dL (ref 32.0–36.0)
MCV: 89.5 fL (ref 80.0–100.0)
MPV: 9.9 fL (ref 7.5–12.5)
Monocytes Relative: 9.8 %
Neutro Abs: 1723 cells/uL (ref 1500–7800)
Neutrophils Relative %: 35.9 %
Platelets: 240 10*3/uL (ref 140–400)
RBC: 5.35 10*6/uL (ref 4.20–5.80)
RDW: 12.7 % (ref 11.0–15.0)
Total Lymphocyte: 49.3 %
WBC: 4.8 10*3/uL (ref 3.8–10.8)

## 2021-03-18 LAB — LIPID PANEL
Cholesterol: 186 mg/dL (ref <200)
HDL: 57 mg/dL (ref >=40)
LDL Cholesterol (Calc): 109 mg/dL (calc) — ABNORMAL HIGH
Non-HDL Cholesterol (Calc): 129 mg/dL (calc) (ref <130)
Total CHOL/HDL Ratio: 3.3 (calc) (ref <5.0)
Triglycerides: 105 mg/dL (ref <150)

## 2021-03-18 LAB — HIV-1 RNA QUANT-NO REFLEX-BLD
HIV 1 RNA Quant: NOT DETECTED Copies/mL
HIV-1 RNA Quant, Log: NOT DETECTED Log cps/mL

## 2021-03-28 ENCOUNTER — Other Ambulatory Visit: Payer: Self-pay | Admitting: Family

## 2021-03-28 DIAGNOSIS — B20 Human immunodeficiency virus [HIV] disease: Secondary | ICD-10-CM

## 2021-03-29 ENCOUNTER — Other Ambulatory Visit: Payer: Self-pay

## 2021-03-29 ENCOUNTER — Ambulatory Visit (INDEPENDENT_AMBULATORY_CARE_PROVIDER_SITE_OTHER): Payer: Self-pay

## 2021-03-29 ENCOUNTER — Encounter: Payer: Self-pay | Admitting: Infectious Diseases

## 2021-03-29 ENCOUNTER — Ambulatory Visit (INDEPENDENT_AMBULATORY_CARE_PROVIDER_SITE_OTHER): Payer: Self-pay | Admitting: Infectious Diseases

## 2021-03-29 VITALS — BP 125/75 | HR 71 | Temp 98.5°F | Wt 217.1 lb

## 2021-03-29 DIAGNOSIS — Z599 Problem related to housing and economic circumstances, unspecified: Secondary | ICD-10-CM

## 2021-03-29 DIAGNOSIS — Z23 Encounter for immunization: Secondary | ICD-10-CM

## 2021-03-29 DIAGNOSIS — B182 Chronic viral hepatitis C: Secondary | ICD-10-CM

## 2021-03-29 DIAGNOSIS — Z7185 Encounter for immunization safety counseling: Secondary | ICD-10-CM

## 2021-03-29 DIAGNOSIS — Z59819 Housing instability, housed unspecified: Secondary | ICD-10-CM

## 2021-03-29 DIAGNOSIS — Z21 Asymptomatic human immunodeficiency virus [HIV] infection status: Secondary | ICD-10-CM

## 2021-03-29 MED ORDER — BIKTARVY 50-200-25 MG PO TABS: 1.0000 | ORAL_TABLET | Freq: Every day | ORAL | 5 refills | Status: DC

## 2021-03-29 NOTE — Progress Notes (Signed)
Name: Mark [name]  DOB: [birth year] MRN: [id number] PCP: Patient, No Pcp Per (Inactive)     Brief Narrative:  Mark [name] is a 30 y.o. male  is a 30 y.o. with HIV disease, Dx 11-2013.  CD4 nadir > 200 per report of patient  HIV Risk: MSM History of OIs: none Intake Labs 12-2019: Hep B sAg (-), sAb (), cAb (); Hep A (), Hep C (+) Harvoni x 12 weeks (06/2020 - 10/2020) Quantiferon (2017 --> negative) HLA B*5701 (-) G6PD: ()   Previous Regimens: Complera  Stribild  Biktarvy    Genotypes: Not done     Subjective:   Chief Complaint  Patient presents with   Follow-up       HPI: Mark [name] is doing well taking his Biktarvy once a day.  He has questions about his numbers from recent blood draw.  Also wondering how his hepatitis C is doing.  He has long since completed his 12-week course of Harvoni.  No side effects from the [place].  He feels like he missed his second COVID-vaccine dose because he forgot about the appointment.  He is open to getting this today and other vaccines that are recommended.  No new changes to his medical history.  Sleep and mood seem to be doing okay.  Housing still a struggle for him and his boyfriend.  Not currently living with his mother they are currently staying with a friend.  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 Current dental concerns.    Review of Systems  Constitutional:  Negative for chills, fever, malaise/fatigue and weight loss.  HENT:  Negative for sore throat.        No dental problems  Respiratory:  Negative for cough and sputum production.   Cardiovascular:  Negative for chest pain and leg swelling.  Gastrointestinal:  Negative for abdominal pain, diarrhea and vomiting.  Genitourinary:  Negative for dysuria and flank pain.  Musculoskeletal:  Negative for joint pain, myalgias and neck pain.  Skin:  Negative for rash.  Neurological:  Negative for dizziness, tingling and headaches.  Psychiatric/Behavioral:  Negative for  depression and substance abuse. The patient is not nervous/anxious and does not have insomnia.     Past Medical History:  Diagnosis Date   Bipolar 1 disorder ([place])    Hepatitis C    HIV (human immunodeficiency virus infection) ([place])     Outpatient Medications Prior to Visit  Medication Sig Dispense Refill   acetaminophen (TYLENOL) 325 MG tablet Take 650 mg by mouth every 6 (six) hours as needed.     ibuprofen (ADVIL) 200 MG tablet Take 200 mg by mouth every 6 (six) hours as needed.     bictegravir-emtricitabine-tenofovir AF (BIKTARVY) 50-200-25 MG TABS tablet Take 1 tablet by mouth daily. 30 tablet 5   doxycycline (VIBRAMYCIN) 100 MG capsule Take 1 capsule (100 mg total) by mouth 2 (two) times daily. One po bid x 7 days 14 capsule 0   No facility-administered medications prior to visit.     No Known Allergies  Social History   Tobacco Use   Smoking status: Every Day    Packs/day: 0.40    Pack years: 0.00    Types: Cigarettes   Smokeless tobacco: Never  Substance Use Topics   Alcohol use: Yes    Alcohol/week: 2.0 standard drinks    Types: 2 Glasses of wine per week    Comment: occassionally   Drug use: Yes    Frequency: 7.0 times per week  Types: Marijuana    Family History  Problem Relation Age of Onset   Healthy Mother    Cancer Maternal Grandmother    Kidney failure Maternal Grandmother    Diabetes Other     Social History   Substance and Sexual Activity  Sexual Activity Yes   Partners: Male     Objective:   Vitals:   03/29/21 0835  BP: 125/75  Pulse: 71  Temp: 98.5 F (36.9 C)  TempSrc: Oral  SpO2: 96%  Weight: 217 lb 2 oz (98.5 kg)   Body mass index is 29.45 kg/m.   Physical Exam HENT:     Mouth/Throat:     Mouth: No oral lesions.     Dentition: Normal dentition. No dental caries.  Eyes:     General: No scleral icterus. Cardiovascular:     Rate and Rhythm: Normal rate and regular rhythm.     Heart sounds: Normal heart sounds.   Pulmonary:     Effort: Pulmonary effort is normal.     Breath sounds: Normal breath sounds.  Abdominal:     General: There is no distension.     Palpations: Abdomen is soft.     Tenderness: There is no abdominal tenderness.  Lymphadenopathy:     Cervical: No cervical adenopathy.  Skin:    General: Skin is warm and dry.     Findings: No rash.  Neurological:     Mental Status: He is alert and oriented to person, place, and time.    Lab Results Lab Results  Component Value Date   WBC 4.8 03/15/2021   HGB 16.2 03/15/2021   HCT 47.9 03/15/2021   MCV 89.5 03/15/2021   PLT 240 03/15/2021    Lab Results  Component Value Date   CREATININE 1.16 03/15/2021   BUN 11 03/15/2021   NA 139 03/15/2021   K 4.7 03/15/2021   CL 102 03/15/2021   CO2 31 03/15/2021    Lab Results  Component Value Date   ALT 11 03/15/2021   AST 16 03/15/2021   GGT 114 (H) 03/27/2020   ALKPHOS 70 07/01/2019   BILITOT 0.4 03/15/2021    Lab Results  Component Value Date   CHOL 186 03/15/2021   HDL 57 03/15/2021   LDLCALC 109 (H) 03/15/2021   TRIG 105 03/15/2021   CHOLHDL 3.3 03/15/2021   HIV 1 RNA Quant (Copies/mL)  Date Value  03/15/2021 Not Detected  12/07/2020 31 (H)  08/08/2020 <20   CD4 T Cell Abs (/uL)  Date Value  03/15/2021 676  08/08/2020 555  01/12/2020 460     Assessment & Plan:   Problem List Items Addressed This Visit       Unprioritized   HIV (human immunodeficiency virus infection) ([place]) (Chronic)    Mark [name] continues to do very well on Biktarvy once daily for HIV treatment.  His viral load is undetectable and his CD4 count has recovered and remains in the normal healthy range recently above 600.  Vaccines for routine care were discussed and updated today. His partner who is with him today is on once daily prep but has had trouble accessing his medication.  We will add him to my clinic today so we can investigate for him. No dental needs identified.  Working with [place] for  housing needs.  Mental health seems to be stable. Continue Biktarvy once daily. Return in about 3 months (around 06/29/2021).        Relevant Medications   bictegravir-emtricitabine-tenofovir AF (BIKTARVY) 50-200-25  MG TABS tablet   Chronic hepatitis C without hepatic coma (HCC) (Chronic)    I will check hep C RNA at the next office visit in 3 months but we discussed that it appears he has been cured from his infection preliminarily.  He will need hepatitis A boosters in the future.  Hepatitis B immune.       Relevant Medications   bictegravir-emtricitabine-tenofovir AF (BIKTARVY) 50-200-25 MG TABS tablet   Vaccine counseling    Counseled today regarding second [place] vaccine.  He is open to receiving this today.  We talked about management strategies for potential side effects he may experience over the next 48 to 72 hours. Prevnar given today as well.  Pneumovax at the following office visit with [facility]. No dental needs identified today.       Housing situation unstable    Referral current for [facility]P services.       Other Visit Diagnoses     Asymptomatic HIV infection ([place])       Relevant Medications   bictegravir-emtricitabine-tenofovir AF (BIKTARVY) 50-200-25 MG TABS tablet       Janene Madeira, MSN, NP-C [facility] for [place] Pager: [phone] Office: [phone]

## 2021-03-29 NOTE — Assessment & Plan Note (Signed)
Mark [name] continues to do very well on Biktarvy once daily for HIV treatment.  His viral load is undetectable and his CD4 count has recovered and remains in the normal healthy range recently above 600.  Vaccines for routine care were discussed and updated today. His partner who is with him today is on once daily prep but has had trouble accessing his medication.  We will add him to my clinic today so we can investigate for him. No dental needs identified.  Working with [facility] for housing needs.  Mental health seems to be stable. Continue Biktarvy once daily. Return in about 3 months (around 06/29/2021).

## 2021-03-29 NOTE — Progress Notes (Signed)
   Covid-19 Vaccination Clinic  Name:  Mark [name]    MRN: [id number] DOB: 08/17/1991  03/29/2021  [name] was observed post Covid-19 immunization for 15 minutes without incident. He was provided with Vaccine Information Sheet and instruction to access the V-Safe system.   [name] was instructed to call 911 with any severe reactions post vaccine: Difficulty breathing  Swelling of face and throat  A fast heartbeat  A bad rash all over body  Dizziness and weakness   Immunizations Administered     Name Date Dose VIS Date Route   PFIZER Comrnaty(Gray TOP) Covid-19 Vaccine 03/29/2021  9:22 AM 0.3 mL 09/07/2020 Intramuscular   Manufacturer: [employer], [employer]   Lot: [id number]   NDC: [phone]

## 2021-03-29 NOTE — Assessment & Plan Note (Signed)
Referral current for THP services.

## 2021-03-29 NOTE — Assessment & Plan Note (Signed)
Counseled today regarding second Pfizer vaccine.  He is open to receiving this today.  We talked about management strategies for potential side effects he may experience over the next 48 to 72 hours. Prevnar given today as well.  Pneumovax at the following office visit with Menveo. No dental needs identified today.

## 2021-03-29 NOTE — Assessment & Plan Note (Signed)
I will check hep C RNA at the next office visit in 3 months but we discussed that it appears he has been cured from his infection preliminarily.  He will need hepatitis A boosters in the future.  Hepatitis B immune.

## 2021-03-29 NOTE — Addendum Note (Signed)
Addended by: Tawnya Crook on: 03/29/2021 12:28 PM   Modules accepted: Orders

## 2021-03-29 NOTE — Patient Instructions (Addendum)
Nice to see you! Happy early birthday!  Looks like your hepatitis C has been cured! Congratulations!!   Will see you and your partner back in 3 months to check in again and finish updating other vaccines.    For the next 2-3 days take ibuprofen 2 - 3 tabs together every 8-12 hours for aches / fever from your booster today.

## 2021-04-10 ENCOUNTER — Ambulatory Visit: Payer: Self-pay

## 2021-06-29 ENCOUNTER — Ambulatory Visit: Payer: Self-pay | Admitting: Infectious Diseases

## 2021-07-06 ENCOUNTER — Telehealth: Payer: Self-pay

## 2021-07-06 NOTE — Telephone Encounter (Signed)
Patient called requesting work note for dental appointment earlier this week. Provided him with phone number for [facility] in order to contact dental office.   Sandie Ano, RN

## 2021-07-17 ENCOUNTER — Ambulatory Visit: Payer: Self-pay | Admitting: Infectious Diseases

## 2021-09-16 ENCOUNTER — Other Ambulatory Visit: Payer: Self-pay

## 2021-09-16 ENCOUNTER — Emergency Department (HOSPITAL_COMMUNITY)
Admission: EM | Admit: 2021-09-16 | Discharge: 2021-09-16 | Disposition: A | Discharge status: Home / Self Care | Payer: Self-pay | Attending: Emergency Medicine | Admitting: Emergency Medicine

## 2021-09-16 ENCOUNTER — Encounter (HOSPITAL_COMMUNITY): Payer: Self-pay | Admitting: Emergency Medicine

## 2021-09-16 DIAGNOSIS — Z23 Encounter for immunization: Secondary | ICD-10-CM | POA: Insufficient documentation

## 2021-09-16 DIAGNOSIS — L03211 Cellulitis of face: Secondary | ICD-10-CM

## 2021-09-16 DIAGNOSIS — F17210 Nicotine dependence, cigarettes, uncomplicated: Secondary | ICD-10-CM | POA: Insufficient documentation

## 2021-09-16 MED ORDER — TETANUS-DIPHTH-ACELL PERTUSSIS 5-2.5-18.5 LF-MCG/0.5 IM SUSY
0.5000 mL | PREFILLED_SYRINGE | Freq: Once | INTRAMUSCULAR | Status: AC
  Administered 2021-09-16: 22:00:00 0.5 mL via INTRAMUSCULAR
  Filled 2021-09-16: qty 0.5

## 2021-09-16 MED ORDER — SULFAMETHOXAZOLE-TRIMETHOPRIM 800-160 MG PO TABS
1.0000 | ORAL_TABLET | Freq: Once | ORAL | Status: AC
  Administered 2021-09-16: 22:00:00 1 via ORAL
  Filled 2021-09-16: qty 1

## 2021-09-16 MED ORDER — SULFAMETHOXAZOLE-TRIMETHOPRIM 800-160 MG PO TABS: 1.0000 | ORAL_TABLET | Freq: Two times a day (BID) | ORAL | 0 refills | Status: AC

## 2021-09-16 MED ORDER — IBUPROFEN 600 MG PO TABS: 600.0000 mg | ORAL_TABLET | Freq: Four times a day (QID) | ORAL | 0 refills | Status: AC | PRN

## 2021-09-16 NOTE — Discharge Instructions (Addendum)
You have been diagnosed with an infected facial pimple.  This could be an early forming abscess.  Please apply warm moist compress to affected area several times daily to aid with healing.  Take antibiotic as prescribed.  Take ibuprofen as needed for pain.  If you notice no improvement of symptoms after 2-3 days, please return for further care.

## 2021-09-16 NOTE — ED Notes (Signed)
Pt NAD,a/ox4. Pt states he developed a pimple 3 days ago, popped it, but then it developed into swelling and pain. Denies fever or redness.

## 2021-09-16 NOTE — ED Notes (Signed)
Pt NAD, a/ox4. Pt verbalizes understanding of all DC and f/u instructions. All questions answered. Pt walks with steady gait to lobby at DC.  ? ?

## 2021-09-16 NOTE — ED Provider Notes (Signed)
Emergency Medicine Provider Triage Evaluation Note  Mark [name] , a 30 y.o. male  was evaluated in triage.  Pt complains of facial abscess. States that same began as a pimple 3 days ago, he tried to pop it with production of some purulent drainage, but then states it has proceeded to swell. Located on right cheek. Hx of abscess drain to left neck. Took Amoxicillin that had for previous dental infection and Ibuprofen without relief. Denies fevers, no dental pain  Review of Systems  Positive: R cheek abscess Negative: Fever  Physical Exam  BP 136/88 (BP Location: Left Arm)    Pulse 91    Temp 99.1 F (37.3 C) (Oral)    Resp 18    SpO2 100%  Gen:   Awake, no distress   Resp:  Normal effort  MSK:   Moves extremities without difficulty  Other:  Area of erythema with fluctuance and surrounding induration noted to the right cheek. No dental involvement  Medical Decision Making  Medically screening exam initiated at 2:55 PM.  Appropriate orders placed.  Mark [name] was informed that the remainder of the evaluation will be completed by another provider, this initial triage assessment does not replace that evaluation, and the importance of remaining in the ED until their evaluation is complete.  I&D   Mark [name] 09/16/21 1458    Mark Mulders, MD 09/19/21 1330

## 2021-09-16 NOTE — ED Provider Notes (Signed)
[facility] EMERGENCY DEPARTMENT Provider Note   CSN: [id number] Arrival date & time: 09/16/21  1312     History Chief Complaint  Patient presents with   Facial Swelling    Mark [name] is a 30 y.o. male.  The history is provided by the patient. No language interpreter was used.   30 year old male significant history of HIV, hepatitis C, bipolar, who presents for evaluation of facial swelling.  Patient noticed a pimple on his right cheek on his face 3 days ago that he tried to pop it.  Since then it has increased in size and become more painful.  Pain is sharp throbbing 4 out of 10 nonradiating.  He denies any dental pain denies any fever and denies any injury.  He is unsure his last tetanus status.  He did try taking Tylenol, ibuprofen, using both hot and cold compress as well as taking some level amoxicillin without any relief.  Past Medical History:  Diagnosis Date   Bipolar 1 disorder (HCC)    Hepatitis C    HIV (human immunodeficiency virus infection) (HCC)     Patient Active Problem List   Diagnosis Date Noted   Housing situation unstable 10/11/2020   Vaccine counseling 06/27/2020   HIV (human immunodeficiency virus infection) (HCC) 02/07/2020   Chronic hepatitis C without hepatic coma (HCC) 02/07/2020   Anxiety 02/07/2020   Bipolar 1 disorder, depressed (HCC) 12/20/2014   Systolic murmur 12/20/2014    History reviewed. No pertinent surgical history.     Family History  Problem Relation Age of Onset   Healthy Mother    Cancer Maternal Grandmother    Kidney failure Maternal Grandmother    Diabetes Other     Social History   Tobacco Use   Smoking status: Every Day    Packs/day: 0.40    Types: Cigarettes   Smokeless tobacco: Never  Substance Use Topics   Alcohol use: Yes    Alcohol/week: 2.0 standard drinks    Types: 2 Glasses of wine per week    Comment: occassionally   Drug use: Yes    Frequency: 7.0 times per week    Types:  Marijuana    Home Medications Prior to Admission medications   Medication Sig Start Date End Date Taking? Authorizing Provider  acetaminophen (TYLENOL) 325 MG tablet Take 650 mg by mouth every 6 (six) hours as needed.    [provider]  bictegravir-emtricitabine-tenofovir AF (BIKTARVY) 50-200-25 MG TABS tablet Take 1 tablet by mouth daily. 03/29/21   Blanchard Kelch, NP  ibuprofen (ADVIL) 200 MG tablet Take 200 mg by mouth every 6 (six) hours as needed.    [provider]    Allergies    Patient has no known allergies.  Review of Systems   Review of Systems  Constitutional:  Negative for fever.  Skin:  Negative for rash and wound.   Physical Exam Updated Vital Signs BP 131/80 (BP Location: Left Arm)    Pulse 78    Temp 98.7 F (37.1 C) (Oral)    Resp 17    SpO2 96%   Physical Exam Vitals and nursing note reviewed.  Constitutional:      General: He is not in acute distress.    Appearance: He is well-developed.  HENT:     Head: Atraumatic.  Eyes:     Conjunctiva/sclera: Conjunctivae normal.  Musculoskeletal:     Cervical back: Neck supple.  Skin:    Findings: No rash.  Comments: Face: Right cheek there is an area of induration without any significant fluctuance noted measuring approximately 1 cm in diameter, tender to palpation without surrounding erythema.  No dental involvement.  Neurological:     Mental Status: He is alert.    ED Results / Procedures / Treatments   Labs (all labs ordered are listed, but only abnormal results are displayed) Labs Reviewed - No data to display  EKG None  Radiology No results found.  Procedures Procedures   Medications Ordered in ED Medications  sulfamethoxazole-trimethoprim (BACTRIM DS) 800-160 MG per tablet 1 tablet (has no administration in time range)  Tdap (BOOSTRIX) injection 0.5 mL (has no administration in time range)    ED Course  I have reviewed the triage vital signs and the nursing  notes.  Pertinent labs & imaging results that were available during my care of the patient were reviewed by me and considered in my medical decision making (see chart for details).    MDM Rules/Calculators/A&P                         BP 131/80 (BP Location: Left Arm)    Pulse 78    Temp 98.7 F (37.1 C) (Oral)    Resp 17    SpO2 96%      Final Clinical Impression(s) / ED Diagnoses Final diagnoses:  Facial cellulitis    Rx / DC Orders ED Discharge Orders          Ordered    sulfamethoxazole-trimethoprim (BACTRIM DS) 800-160 MG tablet  2 times daily        09/16/21 2142    ibuprofen (ADVIL) 600 MG tablet  Every 6 hours PRN        09/16/21 2142           9:35 PM Pt tried popping a pimple on R cheek of face 3 days prior and it has enlarge in size.  There's induration to affected area with a small skin disruption where he have popped the pimple.  No significant fluctuance noted.  I discussed option of I&D vs abx, warm compress and to return in 48 hrs if no improvement.  Pt opted for conservative treatment at this time. Will prescribe bactrim, update tdap and recommend warm compress along with return precaution.  Pt voice understanding and agrees with plan.  No evidence of orbital or preseptal cellulitis.  No dental infection.    Fayrene Helper, PA-C 09/16/21 2149    Cathren Laine, MD 09/16/21 2200

## 2021-09-16 NOTE — ED Notes (Signed)
PT stepped outside to smoke

## 2021-09-16 NOTE — ED Triage Notes (Addendum)
C/o bump on R check that he reports started as a pimple 3 days ago and now has R sided facial swelling.  Denies dental pain.  Reports chills.  Taking ibuprofen and started taking a previous Rx of Amoxicillin.

## 2021-11-26 ENCOUNTER — Telehealth: Payer: Self-pay

## 2021-11-26 NOTE — Telephone Encounter (Signed)
Called patient in regards to appointment request, patient is wanting to be seen due to ear problems.. Checked with nurse to see if I could schedule patient, was checking to see if patient had a pcp.. Patient did not answer, left patient a voicemail to call us back.

## 2021-12-07 ENCOUNTER — Other Ambulatory Visit: Payer: Self-pay

## 2021-12-07 DIAGNOSIS — Z21 Asymptomatic human immunodeficiency virus [HIV] infection status: Secondary | ICD-10-CM

## 2021-12-07 DIAGNOSIS — Z113 Encounter for screening for infections with a predominantly sexual mode of transmission: Secondary | ICD-10-CM

## 2021-12-10 ENCOUNTER — Other Ambulatory Visit: Payer: Self-pay

## 2021-12-10 DIAGNOSIS — B20 Human immunodeficiency virus [HIV] disease: Secondary | ICD-10-CM

## 2021-12-19 ENCOUNTER — Other Ambulatory Visit: Payer: Self-pay | Admitting: Infectious Diseases

## 2021-12-19 DIAGNOSIS — Z21 Asymptomatic human immunodeficiency virus [HIV] infection status: Secondary | ICD-10-CM

## 2021-12-31 ENCOUNTER — Ambulatory Visit: Payer: Self-pay | Admitting: Infectious Diseases

## 2021-12-31 NOTE — Progress Notes (Deleted)
? ?Name: Mark [name]  ?DOB: [birth year] ?MRN: [id number] ?PCP: No primary care provider on file.  ? ? ? ?Brief Narrative:  ?Mark [name] is a 31 y.o. male  is a 31 y.o. with HIV disease, Dx 11-2013.  ?CD4 nadir > 200 per report of patient  ?HIV Risk: MSM ?History of OIs: none ?Intake Labs 12-2019: ?Hep B sAg (-), sAb (), cAb (); Hep A (), Hep C (+) Harvoni x 12 weeks (06/2020 - 10/2020) ?Quantiferon (2017 --> negative) ?HLA B*5701 (-) ?G6PD: () ? ? ?Previous Regimens: ?Complera  ?Stribild  ?Biktarvy  ? ? ?Genotypes: ?Not done  ? ? ? ?Subjective:  ? ?No chief complaint on file. ?  ? ? ? ?HPI: ?*** ? ? ?ROS ? ? ?Past Medical History:  ?Diagnosis Date  ? Bipolar 1 disorder ([place])   ? Hepatitis C   ? HIV (human immunodeficiency virus infection) ([place])   ? ? ?Outpatient Medications Prior to Visit  ?Medication Sig Dispense Refill  ? acetaminophen (TYLENOL) 500 MG tablet Take 500 mg by mouth every 6 (six) hours as needed for moderate pain.    ? BIKTARVY 50-200-25 MG TABS tablet TAKE 1 TABLET BY MOUTH DAILY 30 tablet 0  ? ibuprofen (ADVIL) 200 MG tablet Take 200 mg by mouth every 6 (six) hours as needed for headache.    ? ibuprofen (ADVIL) 600 MG tablet Take 1 tablet (600 mg total) by mouth every 6 (six) hours as needed for moderate pain. 30 tablet 0  ? ?No facility-administered medications prior to visit.  ?  ? ?No Known Allergies ? ?Social History  ? ?Tobacco Use  ? Smoking status: Every Day  ?  Packs/day: 0.40  ?  Types: Cigarettes  ? Smokeless tobacco: Never  ?Substance Use Topics  ? Alcohol use: Yes  ?  Alcohol/week: 2.0 standard drinks  ?  Types: 2 Glasses of wine per week  ?  Comment: occassionally  ? Drug use: Yes  ?  Frequency: 7.0 times per week  ?  Types: Marijuana  ? ? ?Family History  ?Problem Relation Age of Onset  ? Healthy Mother   ? Cancer Maternal Grandmother   ? Kidney failure Maternal Grandmother   ? Diabetes Other   ? ? ?Social History  ? ?Substance and Sexual Activity  ?Sexual Activity Yes  ? Partners: Male   ? ? ? ?Objective:  ? ?There were no vitals filed for this visit. ? ?There is no height or weight on file to calculate BMI. ? ? ?Physical Exam ? ?Lab Results ?Lab Results  ?Component Value Date  ? WBC 4.8 03/15/2021  ? HGB 16.2 03/15/2021  ? HCT 47.9 03/15/2021  ? MCV 89.5 03/15/2021  ? PLT 240 03/15/2021  ?  ?Lab Results  ?Component Value Date  ? CREATININE 1.16 03/15/2021  ? BUN 11 03/15/2021  ? NA 139 03/15/2021  ? K 4.7 03/15/2021  ? CL 102 03/15/2021  ? CO2 31 03/15/2021  ?  ?Lab Results  ?Component Value Date  ? ALT 11 03/15/2021  ? AST 16 03/15/2021  ? GGT 114 (H) 03/27/2020  ? ALKPHOS 70 07/01/2019  ? BILITOT 0.4 03/15/2021  ?  ?Lab Results  ?Component Value Date  ? CHOL 186 03/15/2021  ? HDL 57 03/15/2021  ? LDLCALC 109 (H) 03/15/2021  ? TRIG 105 03/15/2021  ? CHOLHDL 3.3 03/15/2021  ? ?HIV 1 RNA Quant (Copies/mL)  ?Date Value  ?03/15/2021 Not Detected  ?12/07/2020 31 (H)  ?08/08/2020 <20  ? ?  CD4 T Cell Abs (/uL)  ?Date Value  ?03/15/2021 676  ?08/08/2020 555  ?01/12/2020 460  ? ? ? ?Assessment & Plan:  ? ?Problem List Items Addressed This Visit   ? ?  ? Unprioritized  ? HIV (human immunodeficiency virus infection) ([place]) (Chronic)  ? Housing situation unstable - Primary  ? Hepatitis C virus infection cured after antiviral drug therapy  ? ?Janene Madeira, MSN, NP-C ?[place] for Infectious Disease ?[facility] ?Pager: [phone] ?Office: [phone] ? ? ?

## 2022-03-07 ENCOUNTER — Telehealth: Payer: Self-pay

## 2022-03-07 DIAGNOSIS — Z21 Asymptomatic human immunodeficiency virus [HIV] infection status: Secondary | ICD-10-CM

## 2022-03-07 MED ORDER — BIKTARVY 50-200-25 MG PO TABS: 1.0000 | ORAL_TABLET | Freq: Every day | ORAL | 0 refills | Status: DC

## 2022-03-07 NOTE — Telephone Encounter (Signed)
Email sent to [facility] case manager to request assistance in getting patient scheduled for appointment.   Sandie Ano, RN

## 2022-03-07 NOTE — Telephone Encounter (Signed)
Received refill request for Biktarvy. Mark [name] is overdue for appointment, called to schedule - number is invalid.   Sandie Ano, RN

## 2022-04-11 ENCOUNTER — Ambulatory Visit: Payer: Self-pay

## 2022-04-11 ENCOUNTER — Encounter: Payer: Self-pay | Admitting: Infectious Diseases

## 2022-04-11 ENCOUNTER — Ambulatory Visit (INDEPENDENT_AMBULATORY_CARE_PROVIDER_SITE_OTHER): Payer: Self-pay | Admitting: Infectious Diseases

## 2022-04-11 ENCOUNTER — Other Ambulatory Visit: Payer: Self-pay

## 2022-04-11 VITALS — BP 121/76 | HR 82 | Temp 98.4°F | Wt 224.0 lb

## 2022-04-11 DIAGNOSIS — Z59819 Housing instability, housed unspecified: Secondary | ICD-10-CM

## 2022-04-11 DIAGNOSIS — K0889 Other specified disorders of teeth and supporting structures: Secondary | ICD-10-CM

## 2022-04-11 DIAGNOSIS — Z113 Encounter for screening for infections with a predominantly sexual mode of transmission: Secondary | ICD-10-CM

## 2022-04-11 DIAGNOSIS — Z21 Asymptomatic human immunodeficiency virus [HIV] infection status: Secondary | ICD-10-CM

## 2022-04-11 DIAGNOSIS — B182 Chronic viral hepatitis C: Secondary | ICD-10-CM

## 2022-04-11 DIAGNOSIS — Z8619 Personal history of other infectious and parasitic diseases: Secondary | ICD-10-CM

## 2022-04-11 MED ORDER — CHLORHEXIDINE GLUCONATE 0.12 % MT SOLN: 15.0000 mL | Freq: Two times a day (BID) | OROMUCOSAL | 0 refills | Status: DC

## 2022-04-11 MED ORDER — BIKTARVY 50-200-25 MG PO TABS: 1.0000 | ORAL_TABLET | Freq: Every day | ORAL | 11 refills | Status: DC

## 2022-04-11 NOTE — Patient Instructions (Addendum)
   Please call Claris Che @ [phone] to schedule a dental appointment  For pain -  Take 3 or 4 of the over the counter ibuprofen with food up to 3 times a day if you need to for pain Can also combine with tylenol 2 extra strength up to 3 times a day OraGel is good to use  I suspect you have an exposed root from a deep cavity Will send in a mouth rinse for you to use twice a day. Use the GoodRx coupon for this.   Please stop by to renew your ADAP before you leave.   Would like to see you back in 6 months.

## 2022-04-11 NOTE — Assessment & Plan Note (Signed)
HCV RNA today

## 2022-04-11 NOTE — Progress Notes (Signed)
Name: Mark [name]  DOB: [birth year] MRN: [id number] PCP: Pcp, No     Brief Narrative:  Mark [name] is a 31 y.o. adult  is a 31 y.o. with HIV disease, Dx 11-2013.  CD4 nadir > 200 per report of patient  HIV Risk: MSM History of OIs: none Intake Labs 12-2019: Hep B sAg (-), sAb (), cAb (); Hep A (), Hep C (+) Harvoni x 12 weeks (06/2020 - 10/2020) Quantiferon (2017 --> negative) HLA B*5701 (-) G6PD: ()   Previous Regimens: Complera  Stribild  Biktarvy    Genotypes: Not done     Subjective:   Chief Complaint  Patient presents with   Follow-up       HPI: She is here with her partner today. She prefers male pronouns and updated her chart recently. Prefers to go by Mark [name]. Not interested in any hormone affirming therapy.   Taking her biktarvy everyday. Has not had any trouble filling or taking this. She has had some unstable housing come up over the last year and finally will have new lease for an apartment soon. Currently living with her grandmother.   Tooth ache - cold bothers it very much. Ache comes up bad at night.  Uses tylenol and advil and OraGel. Takes 2 tylenol and 2 ibuprofen mostly just at night to help.    Review of Systems  Constitutional:  Negative for chills, fever, malaise/fatigue and weight loss.  HENT:  Negative for sore throat.        No dental problems  Respiratory:  Negative for cough and sputum production.   Cardiovascular:  Negative for chest pain and leg swelling.  Gastrointestinal:  Negative for abdominal pain, diarrhea and vomiting.  Genitourinary:  Negative for dysuria and flank pain.  Musculoskeletal:  Negative for joint pain, myalgias and neck pain.  Skin:  Negative for rash.  Neurological:  Negative for dizziness, tingling and headaches.  Psychiatric/Behavioral:  Negative for depression and substance abuse. The patient is not nervous/anxious and does not have insomnia.      Past Medical History:  Diagnosis Date   Bipolar 1  disorder ([place])    Hepatitis C    HIV (human immunodeficiency virus infection) ([place])     Outpatient Medications Prior to Visit  Medication Sig Dispense Refill   acetaminophen (TYLENOL) 500 MG tablet Take 500 mg by mouth every 6 (six) hours as needed for moderate pain.     ibuprofen (ADVIL) 200 MG tablet Take 200 mg by mouth every 6 (six) hours as needed for headache.     ibuprofen (ADVIL) 600 MG tablet Take 1 tablet (600 mg total) by mouth every 6 (six) hours as needed for moderate pain. 30 tablet 0   bictegravir-emtricitabine-tenofovir AF (BIKTARVY) 50-200-25 MG TABS tablet Take 1 tablet by mouth daily. 30 tablet 0   No facility-administered medications prior to visit.     No Known Allergies  Social History   Tobacco Use   Smoking status: Every Day    Packs/day: 0.40    Types: Cigarettes   Smokeless tobacco: Never  Substance Use Topics   Alcohol use: Yes    Alcohol/week: 2.0 standard drinks of alcohol    Types: 2 Glasses of wine per week    Comment: occassionally   Drug use: Yes    Frequency: 7.0 times per week    Types: Marijuana    Family History  Problem Relation Age of Onset   Healthy Mother    Cancer Maternal Grandmother  Kidney failure Maternal Grandmother    Diabetes Other     Social History   Substance and Sexual Activity  Sexual Activity Yes   Partners: Male     Objective:   Vitals:   04/11/22 1449  BP: 121/76  Pulse: 82  Temp: 98.4 F (36.9 C)  TempSrc: Temporal  Weight: 224 lb (101.6 kg)   Body mass index is 30.38 kg/m.   Physical Exam HENT:     Mouth/Throat:     Mouth: No oral lesions.     Dentition: Normal dentition. No dental caries.  Eyes:     General: No scleral icterus. Cardiovascular:     Rate and Rhythm: Normal rate and regular rhythm.     Heart sounds: Normal heart sounds.  Pulmonary:     Effort: Pulmonary effort is normal.     Breath sounds: Normal breath sounds.  Abdominal:     General: There is no distension.      Palpations: Abdomen is soft.     Tenderness: There is no abdominal tenderness.  Lymphadenopathy:     Cervical: No cervical adenopathy.  Skin:    General: Skin is warm and dry.     Findings: No rash.  Neurological:     Mental Status: She is alert and oriented to person, place, and time.     Lab Results Lab Results  Component Value Date   WBC 4.8 03/15/2021   HGB 16.2 03/15/2021   HCT 47.9 03/15/2021   MCV 89.5 03/15/2021   PLT 240 03/15/2021    Lab Results  Component Value Date   CREATININE 1.16 03/15/2021   BUN 11 03/15/2021   NA 139 03/15/2021   K 4.7 03/15/2021   CL 102 03/15/2021   CO2 31 03/15/2021    Lab Results  Component Value Date   ALT 11 03/15/2021   AST 16 03/15/2021   GGT 114 (H) 03/27/2020   ALKPHOS 70 07/01/2019   BILITOT 0.4 03/15/2021    Lab Results  Component Value Date   CHOL 186 03/15/2021   HDL 57 03/15/2021   LDLCALC 109 (H) 03/15/2021   TRIG 105 03/15/2021   CHOLHDL 3.3 03/15/2021   HIV 1 RNA Quant (Copies/mL)  Date Value  03/15/2021 Not Detected  12/07/2020 31 (H)  08/08/2020 <20   CD4 T Cell Abs (/uL)  Date Value  03/15/2021 676  08/08/2020 555  01/12/2020 460     Assessment & Plan:   Problem List Items Addressed This Visit       Unprioritized   HIV (human immunodeficiency virus infection) ([place]) - Primary (Chronic)    Very well controlled on once daily Biktarvy. No concerns with access or adherence to medication. They are tolerating the medication well without side effects. No drug interactions identified.  Reminded about ADAP re-enrollment in July / January. Met with team today.  Dental needs addressed today with appt to schedule visit and Peridex.  No concern over anxious/depressed mood.  Sexual health and family planning discussed including screening needs. Routine urine and RPR collected.   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 Has lease agreement and moving in soon to new apartment. Temporarily housed with her grandmother.       Tooth pain    Suspect exposed root with deep cavity. CHG rinse given today. No signs of infection. Dental information provided to schedule today.       Other Visit Diagnoses     Screening examination for venereal disease       Relevant Orders   Urine cytology ancillary only   RPR   Chronic hepatitis C without hepatic coma ([place])       Relevant Medications   bictegravir-emtricitabine-tenofovir AF (BIKTARVY) 50-200-25 MG TABS tablet   Other Relevant Orders   Hepatitis C RNA quantitative ([facility])   Asymptomatic HIV infection ([place])       Relevant Medications   bictegravir-emtricitabine-tenofovir AF (BIKTARVY) 50-200-25 MG TABS tablet      Janene Madeira, MSN, NP-C [facility] for [place] Pager: [phone] Office: [phone]

## 2022-04-11 NOTE — Assessment & Plan Note (Signed)
Very well controlled on once daily Biktarvy. No concerns with access or adherence to medication. They are tolerating the medication well without side effects. No drug interactions identified.  Reminded about ADAP re-enrollment in July / January. Met with team today.  Dental needs addressed today with appt to schedule visit and Peridex.  No concern over anxious/depressed mood.  Sexual health and family planning discussed including screening needs. Routine urine and RPR collected.   RTC in 16m

## 2022-04-11 NOTE — Assessment & Plan Note (Signed)
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 Has lease agreement and moving in soon to new apartment. Temporarily housed with her grandmother.

## 2022-04-11 NOTE — Assessment & Plan Note (Signed)
Suspect exposed root with deep cavity. CHG rinse given today. No signs of infection. Dental information provided to schedule today.

## 2022-04-12 LAB — URINE CYTOLOGY ANCILLARY ONLY
Chlamydia: NEGATIVE
Comment: NEGATIVE
Comment: NORMAL
Neisseria Gonorrhea: NEGATIVE

## 2022-04-12 LAB — T-HELPER CELLS (CD4) COUNT (NOT AT ARMC)
CD4 % Helper T Cell: 38 % (ref 33–65)
CD4 T Cell Abs: 729 /uL (ref 400–1790)

## 2022-04-15 ENCOUNTER — Encounter: Payer: Self-pay | Admitting: Infectious Diseases

## 2022-04-15 LAB — CBC
HCT: 47.3 % (ref 38.5–50.0)
Hemoglobin: 16.1 g/dL (ref 13.2–17.1)
MCH: 30.1 pg (ref 27.0–33.0)
MCHC: 34 g/dL (ref 32.0–36.0)
MCV: 88.4 fL (ref 80.0–100.0)
MPV: 9.9 fL (ref 7.5–12.5)
Platelets: 239 10*3/uL (ref 140–400)
RBC: 5.35 10*6/uL (ref 4.20–5.80)
RDW: 13 % (ref 11.0–15.0)
WBC: 5.2 10*3/uL (ref 3.8–10.8)

## 2022-04-15 LAB — HEPATITIS C RNA QUANTITATIVE
HCV Quantitative Log: <1.18 log IU/mL
HCV RNA, PCR, QN: <15 IU/mL

## 2022-04-15 LAB — COMPLETE METABOLIC PANEL WITH GFR
AG Ratio: 1.6 (calc) (ref 1.0–2.5)
ALT: 17 U/L (ref 9–46)
AST: 19 U/L (ref 10–40)
Albumin: 4.6 g/dL (ref 3.6–5.1)
Alkaline phosphatase (APISO): 75 U/L (ref 36–130)
BUN: 11 mg/dL (ref 7–25)
CO2: 28 mmol/L (ref 20–32)
Calcium: 9.6 mg/dL (ref 8.6–10.3)
Chloride: 105 mmol/L (ref 98–110)
Creat: 1.15 mg/dL (ref 0.60–1.26)
Globulin: 2.8 g/dL (calc) (ref 1.9–3.7)
Glucose, Bld: 76 mg/dL (ref 65–99)
Potassium: 4.6 mmol/L (ref 3.5–5.3)
Sodium: 141 mmol/L (ref 135–146)
Total Bilirubin: 0.4 mg/dL (ref 0.2–1.2)
Total Protein: 7.4 g/dL (ref 6.1–8.1)
eGFR: 87 mL/min/{1.73_m2} (ref >=60)

## 2022-04-15 LAB — RPR: RPR Ser Ql: NONREACTIVE

## 2022-04-15 LAB — HIV-1 RNA QUANT-NO REFLEX-BLD
HIV 1 RNA Quant: NOT DETECTED Copies/mL
HIV-1 RNA Quant, Log: NOT DETECTED Log cps/mL

## 2022-10-10 ENCOUNTER — Ambulatory Visit: Payer: Medicaid Other | Admitting: Infectious Diseases

## 2022-12-03 ENCOUNTER — Ambulatory Visit: Payer: Medicaid Other | Admitting: Infectious Diseases

## 2022-12-03 ENCOUNTER — Telehealth: Payer: Self-pay

## 2022-12-03 NOTE — Telephone Encounter (Signed)
Called patient to see if she would be able to make it to today's appointment, received busy signal.   Beryle Flock, RN

## 2023-11-25 ENCOUNTER — Telehealth: Payer: Self-pay

## 2023-11-25 NOTE — Telephone Encounter (Signed)
 Patient considered out of care.  Last [facility] Visit: 04/11/22  Last HIV Viral Load:  HIV 1 RNA Quant  Date Value Ref Range Status  04/11/2022 Not Detected Copies/mL Final    Last CD4 Count:  CD4 T Cell Abs  Date Value Ref Range Status  04/11/2022 729 400 - 1,790 /uL Final    Medication Dispense History:   Dispensed Days Supply Quantity Provider Pharmacy  BIKTARVY 50-200-25 MG TABLET 11/25/2022 30 30 tablet Dixon, Gomez Cleverly, NP [facility] #...    Interventions: Called [place] to schedule follow up appointment, no answer. Left HIPAA compliant voicemail requesting callback.   Will reach out via MyChart.   Sandie Ano, RN

## 2023-12-04 ENCOUNTER — Other Ambulatory Visit (HOSPITAL_COMMUNITY)
Admission: RE | Admit: 2023-12-04 | Discharge: 2023-12-04 | Disposition: A | Discharge status: Home / Self Care | Source: Ambulatory Visit | Attending: Infectious Diseases | Admitting: Infectious Diseases

## 2023-12-04 ENCOUNTER — Telehealth: Payer: Self-pay

## 2023-12-04 ENCOUNTER — Encounter: Payer: Self-pay | Admitting: Infectious Diseases

## 2023-12-04 ENCOUNTER — Ambulatory Visit (INDEPENDENT_AMBULATORY_CARE_PROVIDER_SITE_OTHER): Admitting: Infectious Diseases

## 2023-12-04 ENCOUNTER — Other Ambulatory Visit: Payer: Self-pay

## 2023-12-04 ENCOUNTER — Other Ambulatory Visit (HOSPITAL_COMMUNITY): Payer: Self-pay

## 2023-12-04 VITALS — BP 125/83 | HR 67 | Resp 16 | Ht 72.0 in | Wt 219.7 lb

## 2023-12-04 DIAGNOSIS — Z23 Encounter for immunization: Secondary | ICD-10-CM | POA: Diagnosis not present

## 2023-12-04 DIAGNOSIS — R011 Cardiac murmur, unspecified: Secondary | ICD-10-CM

## 2023-12-04 DIAGNOSIS — Z113 Encounter for screening for infections with a predominantly sexual mode of transmission: Secondary | ICD-10-CM | POA: Insufficient documentation

## 2023-12-04 DIAGNOSIS — Z21 Asymptomatic human immunodeficiency virus [HIV] infection status: Secondary | ICD-10-CM | POA: Diagnosis present

## 2023-12-04 MED ORDER — BIKTARVY 50-200-25 MG PO TABS: 1.0000 | ORAL_TABLET | Freq: Every day | ORAL | 5 refills | Status: AC

## 2023-12-04 NOTE — Patient Instructions (Addendum)
 Make sure you start the biktarvy back one pill once a day -  Separate it from multivitamins by 6 hours.   I don't really hear a murmur but we can arrange an ultrasound to make certain since it's on your chart and your insurance provider heard it recently. It is good that you don't seem to have any functional limitations from it.   Have your partner make an appointment with our pharmacy team to discuss PREP options if you would like to do that.   For primary care -  [facility] is accepting new patients. This will be helpful if you have any heart care that needs follow up, sick visits when I am not here and to follow up with wellness / preventative visits annually.  Call for new patient appointment  Address: [address], [place], [place] [address] Phone: [phone]

## 2023-12-04 NOTE — Telephone Encounter (Signed)
 Scheduled Echo for 12/11/23 at 3 PM at [facility] Left VM with patient to go to [facility]. Also sent this through [employer].

## 2023-12-04 NOTE — Progress Notes (Signed)
 Name: Mark [name]  DOB: [birth year] MRN: [id number] PCP: Pcp, No     Brief Narrative:  Mark [name] is a 33 y.o. adult  is a 33 y.o. with HIV disease, Dx 11-2013.  CD4 nadir > 200 per report of patient  HIV Risk: MSM History of OIs: none Intake Labs 12-2019: Hep B sAg (-), sAb (), cAb (); Hep A (), Hep C (+) Harvoni x 12 weeks (06/2020 - 10/2020) Quantiferon (2017 --> negative) HLA B*5701 (-) G6PD: ()   Previous Regimens: Complera  Stribild  Biktarvy    Genotypes: Not done   Subjective    Subjective:   Chief Complaint  Patient presents with   Follow-up    Home Health Medicaid annual free visit came yesterday and heard a murmur and advised immediate cardio work up.        Discussed the use of AI scribe software for clinical note transcription with the patient, who gave verbal consent to proceed.  History of Present Illness   Mark [name] is a 33 year old here to get back in care for HIV.   They have a history of HIV and were previously on Biktarvy. Their last lab work was two years ago with undetectable viral load. They say that they have had access to a stash of their medication and have not stopped.    They have a history of a heart murmur documented in 2016. They recently had a home visit where the murmur was noted again. They experience occasional difficulty breathing at night, sometimes waking up suddenly coughing and gasping for air. They do not use many pillows and sometimes snore. No significant changes in exercise tolerance, swelling in their legs or stomach, dry cough, or chest pains. However, they experience 'ghost pains' that are random and vary in duration and location sometimes in central chest and sometimes on lateral ribs.   They also have a history of hepatitis c, which was successfully treated in the past.  Requesting STI testing per routine screening - no active symptoms at the moment.      LOV in 2023. Last fill of Biktarvy 10/2022 30d supply    Review of Systems  Constitutional:  Negative for chills, fever, malaise/fatigue and weight loss.  HENT:  Negative for sore throat.        No dental problems  Respiratory:  Negative for cough and sputum production.   Cardiovascular:  Negative for chest pain and leg swelling.  Gastrointestinal:  Negative for abdominal pain, diarrhea and vomiting.  Genitourinary:  Negative for dysuria and flank pain.  Musculoskeletal:  Negative for joint pain, myalgias and neck pain.  Skin:  Negative for rash.  Neurological:  Negative for dizziness, tingling and headaches.  Psychiatric/Behavioral:  Negative for depression and substance abuse. The patient is not nervous/anxious and does not have insomnia.      Past Medical History:  Diagnosis Date   Bipolar 1 disorder ([facility])    Hepatitis C    HIV (human immunodeficiency virus infection) ([facility])     Outpatient Medications Prior to Visit  Medication Sig Dispense Refill   acetaminophen (TYLENOL) 500 MG tablet Take 500 mg by mouth every 6 (six) hours as needed for moderate pain.     chlorhexidine (PERIDEX) 0.12 % solution Use as directed 15 mLs in the mouth or throat 2 (two) times daily. 473 mL 0   ibuprofen (ADVIL) 200 MG tablet Take 200 mg by mouth every 6 (six) hours as needed for headache.  ibuprofen (ADVIL) 600 MG tablet Take 1 tablet (600 mg total) by mouth every 6 (six) hours as needed for moderate pain. 30 tablet 0   Multiple Vitamins-Minerals (HAIR SKIN & NAILS ADVANCED PO) Take by mouth.     bictegravir-emtricitabine-tenofovir AF (BIKTARVY) 50-200-25 MG TABS tablet Take 1 tablet by mouth daily. 30 tablet 11   No facility-administered medications prior to visit.     No Known Allergies  Social History   Tobacco Use   Smoking status: Every Day    Current packs/day: 0.40    Types: Cigarettes   Smokeless tobacco: Never  Substance Use Topics   Alcohol use: Yes    Alcohol/week: 2.0 standard drinks of alcohol    Types: 2 Glasses of wine  per week    Comment: occassionally   Drug use: Yes    Frequency: 7.0 times per week    Types: Marijuana    Family History  Problem Relation Age of Onset   Healthy Mother    Cancer Maternal Grandmother    Kidney failure Maternal Grandmother    Diabetes Other     Social History   Substance and Sexual Activity  Sexual Activity Yes   Partners: Male     Objective:   Vitals:   12/04/23 0947  BP: 125/83  Pulse: 67  Resp: 16  SpO2: 100%  Weight: 219 lb 11.2 oz (99.7 kg)  Height: 6' (1.829 m)    Body mass index is 29.8 kg/m.   Physical Exam HENT:     Mouth/Throat:     Mouth: No oral lesions.     Dentition: Normal dentition. No dental caries.  Eyes:     General: No scleral icterus. Cardiovascular:     Rate and Rhythm: Normal rate and regular rhythm.     Heart sounds: Normal heart sounds.  Pulmonary:     Effort: Pulmonary effort is normal.     Breath sounds: Normal breath sounds.  Abdominal:     General: There is no distension.     Palpations: Abdomen is soft.     Tenderness: There is no abdominal tenderness.  Lymphadenopathy:     Cervical: No cervical adenopathy.  Skin:    General: Skin is warm and dry.     Findings: No rash.  Neurological:     Mental Status: She is alert and oriented to person, place, and time.     Lab Results Lab Results  Component Value Date   WBC 5.2 04/11/2022   HGB 16.1 04/11/2022   HCT 47.3 04/11/2022   MCV 88.4 04/11/2022   PLT 239 04/11/2022    Lab Results  Component Value Date   CREATININE 1.15 04/11/2022   BUN 11 04/11/2022   NA 141 04/11/2022   K 4.6 04/11/2022   CL 105 04/11/2022   CO2 28 04/11/2022    Lab Results  Component Value Date   ALT 17 04/11/2022   AST 19 04/11/2022   GGT 114 (H) 03/27/2020   ALKPHOS 70 07/01/2019   BILITOT 0.4 04/11/2022    Lab Results  Component Value Date   CHOL 186 03/15/2021   HDL 57 03/15/2021   LDLCALC 109 (H) 03/15/2021   TRIG 105 03/15/2021   CHOLHDL 3.3 03/15/2021    HIV 1 RNA Quant (Copies/mL)  Date Value  04/11/2022 Not Detected  03/15/2021 Not Detected  12/07/2020 31 (H)   CD4 T Cell Abs (/uL)  Date Value  04/11/2022 729  03/15/2021 676  08/08/2020 555  Assessment & Plan:     HIV infection Chronic HIV infection with previously undetectable viral load. Emphasized medication adherence to maintain undetectable status and prevent transmission. Explained U=U and options for PrEP for partner. He will schedule with [facility] pharmacy at his availability to discuss further.  - Recheck viral load and CD4 count today. - Recheck liver and kidney function, CBC - Dental referral placed.  - Restart Biktarvy - spearate from multivitamins/supplements and chewable acid reducing medications by 6 hours.   H/O Heart murmur - I don't hear a murmur on exam today. No concerning symptoms reported that would indicated heart failure. Differential includes benign murmur, valvular issue, or congenital defect. Explained echocardiogram utility - he would like to proceed with this for baseline evaluation to ensure nothing else is needed which I agree with. CXR in 2020 without any cardiomegaly. Will consider other work up after echo results reviewed.  - Order echocardiogram to evaluate heart murmur. - Screen for anemia with blood work.  Follow-up Emphasized continuity of care and importance of primary care provider. - Schedule follow-up appointment in 3-4 months. - Coordinate with financial coordinator to ensure Medicaid coverage is active. - Arrange dental clinic appointment. - Administer flu shot. - Perform lab work and swabs. - Schedule echocardiogram.      Meds ordered this encounter  Medications   bictegravir-emtricitabine-tenofovir AF (BIKTARVY) 50-200-25 MG TABS tablet    Sig: Take 1 tablet by mouth daily.    Dispense:  30 tablet    Refill:  5    Patient needs appointment    Prescription Type::   Renewal   Orders Placed This Encounter  Procedures   HIV  RNA, RTPCR W/R GT (RTI, PI,INT)   T-helper cells (CD4) count   RPR   COMPLETE METABOLIC PANEL WITH GFR   CBC w/Diff   ECHOCARDIOGRAM COMPLETE    Standing Status:   Future    Expiration Date:   06/05/2024    Where should this test be performed:   [facility]    Perflutren DEFINITY (image enhancing agent) should be administered unless hypersensitivity or allergy exist:   Administer Perflutren    Reason for exam-Echo:   Murmur R01.1   Return in about 4 months (around 04/04/2024).    Rexene Alberts, MSN, NP-C [facility] for Infectious Disease [facility] Pager: [phone] Office: [phone]

## 2023-12-04 NOTE — Telephone Encounter (Signed)
 [facility] Patient Advocate Encounter  Insurance verification completed.    The patient is insured through [employer] & [employer] .     Ran test claim for DOVATO  The current 30 day co-pay is $0.  Ran test claim for BIKTARVY  The current 30 day co-pay is $0.  Ran test claim for [facility]  This medication is not on formulary and will most likely need a Prior Auth.   We will continue to follow to see if copay assistance is needed.  This test claim was processed through [facility]- copay amounts may vary at other pharmacies due to pharmacy/plan contracts, or as the patient moves through the different stages of their insurance plan.

## 2023-12-05 LAB — URINE CYTOLOGY ANCILLARY ONLY
Chlamydia: NEGATIVE
Comment: NEGATIVE
Comment: NORMAL
Neisseria Gonorrhea: NEGATIVE

## 2023-12-05 LAB — CYTOLOGY, (ORAL, ANAL, URETHRAL) ANCILLARY ONLY
Chlamydia: NEGATIVE
Chlamydia: NEGATIVE
Comment: NEGATIVE
Comment: NEGATIVE
Comment: NORMAL
Comment: NORMAL
Neisseria Gonorrhea: NEGATIVE
Neisseria Gonorrhea: NEGATIVE

## 2023-12-05 LAB — T-HELPER CELLS (CD4) COUNT (NOT AT ARMC)
CD4 % Helper T Cell: 42 % (ref 33–65)
CD4 T Cell Abs: 654 /uL (ref 400–1790)

## 2023-12-08 LAB — CBC WITH DIFFERENTIAL/PLATELET
Absolute Lymphocytes: 1782 {cells}/uL (ref 850–3900)
Absolute Monocytes: 468 {cells}/uL (ref 200–950)
Basophils Absolute: 18 {cells}/uL (ref 0–200)
Basophils Relative: 0.4 %
Eosinophils Absolute: 189 {cells}/uL (ref 15–500)
Eosinophils Relative: 4.2 %
HCT: 47.4 % (ref 38.5–50.0)
Hemoglobin: 15.7 g/dL (ref 13.2–17.1)
MCH: 29.8 pg (ref 27.0–33.0)
MCHC: 33.1 g/dL (ref 32.0–36.0)
MCV: 90.1 fL (ref 80.0–100.0)
MPV: 9.9 fL (ref 7.5–12.5)
Monocytes Relative: 10.4 %
Neutro Abs: 2043 {cells}/uL (ref 1500–7800)
Neutrophils Relative %: 45.4 %
Platelets: 256 10*3/uL (ref 140–400)
RBC: 5.26 10*6/uL (ref 4.20–5.80)
RDW: 12.8 % (ref 11.0–15.0)
Total Lymphocyte: 39.6 %
WBC: 4.5 10*3/uL (ref 3.8–10.8)

## 2023-12-08 LAB — COMPLETE METABOLIC PANEL WITH GFR
AG Ratio: 1.7 (calc) (ref 1.0–2.5)
ALT: 39 U/L (ref 9–46)
AST: 26 U/L (ref 10–40)
Albumin: 4.4 g/dL (ref 3.6–5.1)
Alkaline phosphatase (APISO): 76 U/L (ref 36–130)
BUN: 10 mg/dL (ref 7–25)
CO2: 33 mmol/L — ABNORMAL HIGH (ref 20–32)
Calcium: 9.5 mg/dL (ref 8.6–10.3)
Chloride: 103 mmol/L (ref 98–110)
Creat: 0.95 mg/dL (ref 0.60–1.26)
Globulin: 2.6 g/dL (ref 1.9–3.7)
Glucose, Bld: 70 mg/dL (ref 65–99)
Potassium: 4.4 mmol/L (ref 3.5–5.3)
Sodium: 141 mmol/L (ref 135–146)
Total Bilirubin: 0.4 mg/dL (ref 0.2–1.2)
Total Protein: 7 g/dL (ref 6.1–8.1)
eGFR: 109 mL/min/{1.73_m2} (ref >=60)

## 2023-12-08 LAB — HIV RNA, RTPCR W/R GT (RTI, PI,INT)
HIV 1 RNA Quant: NOT DETECTED {copies}/mL
HIV-1 RNA Quant, Log: NOT DETECTED {Log_copies}/mL

## 2023-12-08 LAB — RPR: RPR Ser Ql: NONREACTIVE

## 2023-12-09 ENCOUNTER — Telehealth: Payer: Self-pay

## 2023-12-09 NOTE — Telephone Encounter (Addendum)
 Reached out to [facility] after they called [facility] to advise Auth needed. I had already advised upon scheduling this that no Berkley Harvey is needed. PreCert confirmed no auth required.

## 2023-12-11 ENCOUNTER — Ambulatory Visit (HOSPITAL_COMMUNITY)
Admission: RE | Admit: 2023-12-11 | Discharge: 2023-12-11 | Disposition: A | Discharge status: Home / Self Care | Source: Ambulatory Visit | Attending: Infectious Diseases | Admitting: Infectious Diseases

## 2023-12-11 DIAGNOSIS — R011 Cardiac murmur, unspecified: Secondary | ICD-10-CM | POA: Insufficient documentation

## 2023-12-11 LAB — ECHOCARDIOGRAM COMPLETE
AR max vel: 3.43 cm2
AV Area VTI: 4.01 cm2
AV Area mean vel: 3.21 cm2
AV Mean grad: 3 mmHg
AV Peak grad: 5.2 mmHg
Ao pk vel: 1.14 m/s
Area-P 1/2: 2.75 cm2
Calc EF: 63.6 %
S' Lateral: 2.9 cm
Single Plane A2C EF: 64.9 %
Single Plane A4C EF: 64 %

## 2023-12-11 NOTE — Progress Notes (Signed)
  Echocardiogram 2D Echocardiogram has been performed.  Ocie Doyne RDCS 12/11/2023, 3:45 PM

## 2024-02-06 ENCOUNTER — Other Ambulatory Visit: Payer: Self-pay

## 2024-02-09 ENCOUNTER — Other Ambulatory Visit (HOSPITAL_COMMUNITY): Payer: Self-pay

## 2024-02-10 ENCOUNTER — Other Ambulatory Visit (HOSPITAL_COMMUNITY): Payer: Self-pay

## 2024-04-21 ENCOUNTER — Ambulatory Visit (INDEPENDENT_AMBULATORY_CARE_PROVIDER_SITE_OTHER): Admitting: Infectious Diseases

## 2024-04-21 ENCOUNTER — Other Ambulatory Visit (HOSPITAL_COMMUNITY)
Admission: RE | Admit: 2024-04-21 | Discharge: 2024-04-21 | Disposition: A | Discharge status: Home / Self Care | Source: Ambulatory Visit | Attending: Infectious Diseases | Admitting: Infectious Diseases

## 2024-04-21 ENCOUNTER — Other Ambulatory Visit: Payer: Self-pay

## 2024-04-21 ENCOUNTER — Encounter: Payer: Self-pay | Admitting: Infectious Diseases

## 2024-04-21 VITALS — BP 127/76 | HR 74 | Temp 98.4°F | Ht 73.0 in | Wt 212.2 lb

## 2024-04-21 DIAGNOSIS — Z1212 Encounter for screening for malignant neoplasm of rectum: Secondary | ICD-10-CM

## 2024-04-21 DIAGNOSIS — Z113 Encounter for screening for infections with a predominantly sexual mode of transmission: Secondary | ICD-10-CM | POA: Diagnosis not present

## 2024-04-21 DIAGNOSIS — Z21 Asymptomatic human immunodeficiency virus [HIV] infection status: Secondary | ICD-10-CM

## 2024-04-21 DIAGNOSIS — Z23 Encounter for immunization: Secondary | ICD-10-CM | POA: Diagnosis present

## 2024-04-21 DIAGNOSIS — K047 Periapical abscess without sinus: Secondary | ICD-10-CM | POA: Diagnosis not present

## 2024-04-21 MED ORDER — AMOXICILLIN-POT CLAVULANATE 875-125 MG PO TABS: 1.0000 | ORAL_TABLET | Freq: Two times a day (BID) | ORAL | 0 refills | Status: AC

## 2024-04-21 MED ORDER — CHLORHEXIDINE GLUCONATE 0.12 % MT SOLN: 15.0000 mL | Freq: Two times a day (BID) | OROMUCOSAL | 0 refills | Status: AC

## 2024-04-21 NOTE — Addendum Note (Signed)
 Addended by: MORGAN LEVORA RAMAN on: 04/21/2024 03:58 PM   Modules accepted: Orders

## 2024-04-21 NOTE — Progress Notes (Signed)
 Name: Mark [name]  DOB: 19-Feb-1991 MRN: [id number] PCP: Pcp, No     Brief Narrative:  Mark [name] is a 33 y.o. adult  is a 33 y.o. with HIV disease, Dx 11-2013.  CD4 nadir > 200 per report of patient  HIV Risk: MSM History of OIs: none Intake Labs 12-2019: Hep B sAg (-), sAb (), cAb (); Hep A (), Hep C (+) Harvoni  x 12 weeks (06/2020 - 10/2020) Quantiferon (2017 --> negative) HLA B*5701 (-) G6PD: ()   Previous Regimens: Complera  Stribild  Biktarvy     Genotypes: Not done   Subjective    Subjective:   Chief Complaint  Patient presents with   Follow-up    Pt would like to discuss his tooth falling out      Discussed the use of AI scribe software for clinical note transcription with the patient, who gave verbal consent to proceed.  History of Present Illness   Mark [name] is a 33 year old male who presents with a toothache and concerns about sexually transmitted infections.  She has been experiencing a toothache originating from dental work performed last year, with some material left in her tooth causing ongoing issues. Recently, a fragment came out from the lower left back tooth. She has not contacted the original dentist due to losing her contact information. She experiences sensitivity to cold, preferring to eat cold fruit. No swelling in the jaw, difficulty opening her mouth, or fevers have been noted.  She is concerned about potential sexually transmitted infections following a recent revelation of infidelity by her partner. She requests sexual health screening, including tests for gonorrhea, chlamydia, and syphilis. No discharge or pain in the genital area is reported, but she mentions experiencing 'random pains' in her sides, described as feeling like being 'stabbed in the side with a very sharp knife for a short time period.' These pains occur approximately four times a month and seem to correlate with stress. No weight loss is reported, but she notes a recent  weight gain, maintaining a consistent weight of 212 pounds.  She reports the presence of warts around the buttocks, which she has attempted to treat with creams and wipes without success. These warts are persistent, sometimes shrinking but never fully disappearing, and are associated with itching. She is concerned about the possibility of HPV and is open to further examination and testing.  Socially, she was born in [place]  and received vaccinations as a child. She is currently not working until the 8th of the month.         04/21/2024    2:57 PM 12/04/2023    9:47 AM 04/11/2022    2:50 PM  Depression screen PHQ 2/9  Decreased Interest 0 0 0  Down, Depressed, Hopeless 0 0 0  PHQ - 2 Score 0 0 0  Altered sleeping 0    Tired, decreased energy 0    Change in appetite 0    Feeling bad or failure about yourself  0    Trouble concentrating 0    Moving slowly or fidgety/restless 0    Suicidal thoughts 0    PHQ-9 Score 0        04/21/2024    2:57 PM  GAD 7 : Generalized Anxiety Score  Nervous, Anxious, on Edge 0  Control/stop worrying 0  Worry too much - different things 0  Trouble relaxing 0  Restless 0  Easily annoyed or irritable 0  Afraid - awful might happen 0  Total GAD 7 Score 0  Anxiety Difficulty Not difficult at all     Review of Systems  Constitutional:  Negative for chills and fever.  HENT:         Dental pain   Gastrointestinal:  Negative for blood in stool, nausea and vomiting.  Genitourinary:  Negative for dysuria.  Skin:  Positive for itching and rash.     Past Medical History:  Diagnosis Date   Bipolar 1 disorder (HCC)    Hepatitis C    HIV (human immunodeficiency virus infection) (HCC)     Outpatient Medications Prior to Visit  Medication Sig Dispense Refill   acetaminophen  (TYLENOL ) 500 MG tablet Take 500 mg by mouth every 6 (six) hours as needed for moderate pain.     bictegravir-emtricitabine-tenofovir AF (BIKTARVY ) 50-200-25 MG TABS tablet  Take 1 tablet by mouth daily. 30 tablet 5   ibuprofen  (ADVIL ) 200 MG tablet Take 200 mg by mouth every 6 (six) hours as needed for headache.     ibuprofen  (ADVIL ) 600 MG tablet Take 1 tablet (600 mg total) by mouth every 6 (six) hours as needed for moderate pain. 30 tablet 0   Multiple Vitamins-Minerals (HAIR SKIN & NAILS ADVANCED PO) Take by mouth.     chlorhexidine  (PERIDEX ) 0.12 % solution Use as directed 15 mLs in the mouth or throat 2 (two) times daily. 473 mL 0   No facility-administered medications prior to visit.     No Known Allergies  Social History   Tobacco Use   Smoking status: Every Day    Current packs/day: 0.40    Types: Cigarettes   Smokeless tobacco: Never  Substance Use Topics   Alcohol use: Yes    Alcohol/week: 2.0 standard drinks of alcohol    Types: 2 Glasses of wine per week    Comment: occassionally   Drug use: Yes    Frequency: 7.0 times per week    Types: Marijuana    Social History   Substance and Sexual Activity  Sexual Activity Yes   Partners: Male     Objective:   Vitals:   04/21/24 1458  BP: 127/76  Pulse: 74  Temp: 98.4 F (36.9 C)  TempSrc: Oral  SpO2: 96%  Weight: 212 lb 3.2 oz (96.3 kg)  Height: 6' 1 (1.854 m)    Body mass index is 28 kg/m.   Physical Exam HENT:     Mouth/Throat:     Dentition: Abnormal dentition. Gingival swelling and dental caries present. No dental tenderness or dental abscesses.      Comments: Broken fragments of teeth noted amongst erythema of the gum line. Also one rotten appearing tooth on same side    Lab Results Lab Results  Component Value Date   WBC 4.5 12/04/2023   HGB 15.7 12/04/2023   HCT 47.4 12/04/2023   MCV 90.1 12/04/2023   PLT 256 12/04/2023    Lab Results  Component Value Date   CREATININE 0.95 12/04/2023   BUN 10 12/04/2023   NA 141 12/04/2023   K 4.4 12/04/2023   CL 103 12/04/2023   CO2 33 (H) 12/04/2023    Lab Results  Component Value Date   ALT 39 12/04/2023    AST 26 12/04/2023   GGT 114 (H) 03/27/2020   ALKPHOS 70 07/01/2019   BILITOT 0.4 12/04/2023    Lab Results  Component Value Date   CHOL 186 03/15/2021   HDL 57 03/15/2021   LDLCALC 109 (H) 03/15/2021   TRIG  105 03/15/2021   CHOLHDL 3.3 03/15/2021   HIV 1 RNA Quant  Date Value  12/04/2023 NOT DETECTED copies/mL  04/11/2022 Not Detected Copies/mL  03/15/2021 Not Detected Copies/mL   CD4 T Cell Abs (/uL)  Date Value  12/04/2023 654  04/11/2022 729  03/15/2021 676     Assessment & Plan:     HIV infection - HIV infection managed with Biktarvy . He has been doing well with adherence lately and working with [facility] for case management support.  Anal cancer screening obtained today with significant presence of warts  - Advise against hoarding medication and emphasize the importance of consistent daily intake to maintain viral suppression. - Provide samples of Biktarvy  if needed to ensure continuous treatment.  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 Will need surgical consultation to address. Wait on results of anal pap smear to determine full scope of referral need.  - Perform a rectal exam and collect a swab for HPV DNA testing. - Refer for potential removal of warts if necessary.  Potential STI exposure - Recent sexual contact with an unfaithful partner. No symptoms of discharge or pain. Concerns about potential STIs. - Perform throat swab, rectal swab, and urine test for STI screening. - Add syphilis screening to blood work. - Advise rechecking syphilis in one month if concerned.  Toothache with possible infection - Toothache in the lower left jaw with tooth fragments present. Possible infection indicated by erythema of the gum line around broken teeth. No fever or swelling. Cold sensitivity reported. Recent dental work may have left fragments causing bacterial growth. - Provide a list of dental providers that accept  Medicaid for follow-up dental care. - Prescribe Peridex  mouthwash to be used twice daily. - Prescribe Augmentin  for 7 days, to be taken twice daily.  Vaccination - Due for meningitis booster. No contraindications noted. - Administer meningitis vaccine (Menveo).      Meds ordered this encounter  Medications   amoxicillin -clavulanate (AUGMENTIN ) 875-125 MG tablet    Sig: Take 1 tablet by mouth 2 (two) times daily.    Dispense:  7 tablet    Refill:  0   chlorhexidine  (PERIDEX ) 0.12 % solution    Sig: Use as directed 15 mLs in the mouth or throat 2 (two) times daily.    Dispense:  473 mL    Refill:  0   Orders Placed This Encounter  Procedures   RPR   Return in about 6 months (around 10/22/2024).    Mark Fireman, MSN, NP-C [facility] Pager: [phone] Office: [phone]

## 2024-04-21 NOTE — Patient Instructions (Addendum)
 Will plan on referring you to surgery for removal of the rectal warts.   Scan the QR code and call around to some of the dental providers.  [place] [facility] Dental Providers:

## 2024-04-22 LAB — URINE CYTOLOGY ANCILLARY ONLY
Chlamydia: NEGATIVE
Comment: NEGATIVE
Comment: NORMAL
Neisseria Gonorrhea: NEGATIVE

## 2024-04-22 LAB — CYTOLOGY, (ORAL, ANAL, URETHRAL) ANCILLARY ONLY
Chlamydia: NEGATIVE
Chlamydia: NEGATIVE
Comment: NEGATIVE
Comment: NEGATIVE
Comment: NORMAL
Comment: NORMAL
Neisseria Gonorrhea: NEGATIVE
Neisseria Gonorrhea: NEGATIVE

## 2024-04-22 LAB — RPR: RPR Ser Ql: NONREACTIVE

## 2024-04-30 LAB — CYTOLOGY - PAP
Comment: NEGATIVE
Comment: NEGATIVE
Comment: NEGATIVE
Diagnosis: UNDETERMINED — AB
HPV 16: NEGATIVE
HPV 18 / 45: NEGATIVE
High risk HPV: POSITIVE — AB

## 2024-05-03 ENCOUNTER — Ambulatory Visit: Payer: Self-pay | Admitting: Infectious Diseases

## 2024-05-03 DIAGNOSIS — Z21 Asymptomatic human immunodeficiency virus [HIV] infection status: Secondary | ICD-10-CM

## 2024-05-03 DIAGNOSIS — R8581 Anal high risk human papillomavirus (HPV) DNA test positive: Secondary | ICD-10-CM

## 2024-05-03 DIAGNOSIS — R85619 Unspecified abnormal cytological findings in specimens from anus: Secondary | ICD-10-CM

## 2024-05-03 NOTE — Progress Notes (Signed)
 Referral to general surgery planned for abnormal pap smear and anorectal warts

## 2024-10-13 ENCOUNTER — Ambulatory Visit: Admitting: Infectious Diseases

## 2024-11-05 ENCOUNTER — Ambulatory Visit: Admitting: Infectious Diseases
